# Patient Record
Sex: Male | Born: 1970 | Race: Black or African American | Hispanic: No | Marital: Single | Smoking: Never smoker
Health system: Southern US, Community
[De-identification: ages and names within clinical notes are randomized; demographics above are authoritative.]

## PROBLEM LIST (undated history)

## (undated) DIAGNOSIS — M549 Dorsalgia, unspecified: Secondary | ICD-10-CM

---

## 2002-02-05 ENCOUNTER — Emergency Department (HOSPITAL_COMMUNITY): Admission: EM | Admit: 2002-02-05 | Discharge: 2002-02-05 | Payer: Self-pay | Admitting: Emergency Medicine

## 2002-12-24 ENCOUNTER — Encounter: Payer: Self-pay | Admitting: *Deleted

## 2002-12-24 ENCOUNTER — Emergency Department (HOSPITAL_COMMUNITY): Admission: EM | Admit: 2002-12-24 | Discharge: 2002-12-24 | Payer: Self-pay | Admitting: *Deleted

## 2003-10-14 ENCOUNTER — Emergency Department (HOSPITAL_COMMUNITY): Admission: EM | Admit: 2003-10-14 | Discharge: 2003-10-14 | Payer: Self-pay | Admitting: Emergency Medicine

## 2004-03-09 ENCOUNTER — Emergency Department (HOSPITAL_COMMUNITY): Admission: EM | Admit: 2004-03-09 | Discharge: 2004-03-09 | Payer: Self-pay | Admitting: Emergency Medicine

## 2005-07-15 ENCOUNTER — Emergency Department (HOSPITAL_COMMUNITY): Admission: EM | Admit: 2005-07-15 | Discharge: 2005-07-15 | Payer: Self-pay | Admitting: Emergency Medicine

## 2005-09-15 ENCOUNTER — Emergency Department (HOSPITAL_COMMUNITY): Admission: EM | Admit: 2005-09-15 | Discharge: 2005-09-15 | Payer: Self-pay | Admitting: Emergency Medicine

## 2006-01-17 ENCOUNTER — Emergency Department (HOSPITAL_COMMUNITY): Admission: EM | Admit: 2006-01-17 | Discharge: 2006-01-17 | Payer: Self-pay | Admitting: Emergency Medicine

## 2006-01-23 ENCOUNTER — Emergency Department (HOSPITAL_COMMUNITY): Admission: EM | Admit: 2006-01-23 | Discharge: 2006-01-23 | Payer: Self-pay | Admitting: Emergency Medicine

## 2006-08-10 ENCOUNTER — Emergency Department (HOSPITAL_COMMUNITY): Admission: EM | Admit: 2006-08-10 | Discharge: 2006-08-10 | Payer: Self-pay | Admitting: Emergency Medicine

## 2007-04-29 ENCOUNTER — Emergency Department (HOSPITAL_COMMUNITY): Admission: EM | Admit: 2007-04-29 | Discharge: 2007-04-29 | Payer: Self-pay | Admitting: Emergency Medicine

## 2009-01-07 ENCOUNTER — Emergency Department (HOSPITAL_COMMUNITY): Admission: EM | Admit: 2009-01-07 | Discharge: 2009-01-07 | Payer: Self-pay | Admitting: Emergency Medicine

## 2010-04-19 ENCOUNTER — Emergency Department (HOSPITAL_COMMUNITY): Admission: EM | Admit: 2010-04-19 | Discharge: 2010-04-19 | Payer: Self-pay | Admitting: Emergency Medicine

## 2010-07-10 ENCOUNTER — Emergency Department (HOSPITAL_COMMUNITY): Admission: EM | Admit: 2010-07-10 | Discharge: 2010-07-10 | Payer: Self-pay | Admitting: Emergency Medicine

## 2012-12-20 ENCOUNTER — Emergency Department (HOSPITAL_COMMUNITY)
Admission: EM | Admit: 2012-12-20 | Discharge: 2012-12-20 | Disposition: A | Discharge status: Home / Self Care | Payer: Self-pay | Attending: Emergency Medicine | Admitting: Emergency Medicine

## 2012-12-20 ENCOUNTER — Emergency Department (HOSPITAL_COMMUNITY): Payer: Self-pay

## 2012-12-20 ENCOUNTER — Encounter (HOSPITAL_COMMUNITY): Payer: Self-pay | Admitting: Emergency Medicine

## 2012-12-20 ENCOUNTER — Other Ambulatory Visit: Payer: Self-pay

## 2012-12-20 DIAGNOSIS — Z8739 Personal history of other diseases of the musculoskeletal system and connective tissue: Secondary | ICD-10-CM | POA: Insufficient documentation

## 2012-12-20 DIAGNOSIS — R52 Pain, unspecified: Secondary | ICD-10-CM | POA: Insufficient documentation

## 2012-12-20 DIAGNOSIS — J029 Acute pharyngitis, unspecified: Secondary | ICD-10-CM | POA: Insufficient documentation

## 2012-12-20 DIAGNOSIS — R0789 Other chest pain: Secondary | ICD-10-CM | POA: Insufficient documentation

## 2012-12-20 DIAGNOSIS — B349 Viral infection, unspecified: Secondary | ICD-10-CM

## 2012-12-20 DIAGNOSIS — R079 Chest pain, unspecified: Secondary | ICD-10-CM

## 2012-12-20 DIAGNOSIS — R509 Fever, unspecified: Secondary | ICD-10-CM | POA: Insufficient documentation

## 2012-12-20 DIAGNOSIS — R05 Cough: Secondary | ICD-10-CM | POA: Insufficient documentation

## 2012-12-20 DIAGNOSIS — R110 Nausea: Secondary | ICD-10-CM | POA: Insufficient documentation

## 2012-12-20 DIAGNOSIS — R51 Headache: Secondary | ICD-10-CM | POA: Insufficient documentation

## 2012-12-20 DIAGNOSIS — B9789 Other viral agents as the cause of diseases classified elsewhere: Secondary | ICD-10-CM | POA: Insufficient documentation

## 2012-12-20 DIAGNOSIS — R059 Cough, unspecified: Secondary | ICD-10-CM | POA: Insufficient documentation

## 2012-12-20 HISTORY — DX: Dorsalgia, unspecified: M54.9

## 2012-12-20 MED ORDER — HYDROCODONE-IBUPROFEN 7.5-200 MG PO TABS: 1.0000 | ORAL_TABLET | Freq: Four times a day (QID) | ORAL | Status: DC | PRN

## 2012-12-20 MED ORDER — HYDROCODONE-ACETAMINOPHEN 5-325 MG PO TABS
1.0000 | ORAL_TABLET | Freq: Once | ORAL | Status: AC
  Administered 2012-12-20: 1 via ORAL
  Filled 2012-12-20: qty 1

## 2012-12-20 MED ORDER — IBUPROFEN 800 MG PO TABS
800.0000 mg | ORAL_TABLET | Freq: Once | ORAL | Status: AC
  Administered 2012-12-20: 800 mg via ORAL
  Filled 2012-12-20: qty 1

## 2012-12-20 MED ORDER — DEXTROMETHORPHAN HBR 15 MG/5ML PO SYRP: 10.0000 mL | ORAL_SOLUTION | Freq: Four times a day (QID) | ORAL | Status: DC | PRN

## 2012-12-20 NOTE — ED Provider Notes (Signed)
History     CSN: [id number]  Arrival date & time 12/20/12  8295   First MD Initiated Contact with Patient 12/20/12 [phone]      Chief Complaint  Patient presents with  . Generalized Body Aches  . Chest Pain    (Consider location/radiation/quality/duration/timing/severity/associated sxs/prior treatment) HPI Trevor Hicks is a 41 y.o. male who presents to the Emergency Department complaining of flu like symptoms since Sunday including headache, sore throat, dry cough, mild nausea, body aches. He has been using Dayquil and Nyquil with some relief. Cough has persisted and he has chest discomfort with the cough.   Past Medical History  Diagnosis Date  . Back pain     History reviewed. No pertinent past surgical history.  History reviewed. No pertinent family history.  History  Substance Use Topics  . Smoking status: Never Smoker   . Smokeless tobacco: Not on file  . Alcohol Use: Yes     Comment: one beer per day      Review of Systems  Constitutional: Positive for fever and chills.       10  Systems reviewed and are negative for acute change except as noted in the HPI.  HENT: Positive for sore throat. Negative for congestion.   Eyes: Negative for discharge and redness.  Respiratory: Positive for cough. Negative for shortness of breath.        Chest discomfort with cough  Cardiovascular: Negative for chest pain.  Gastrointestinal: Negative for vomiting and abdominal pain.  Musculoskeletal: Negative for back pain.  Skin: Negative for rash.  Neurological: Positive for headaches. Negative for syncope and numbness.  Psychiatric/Behavioral:       No behavior change.    Allergies  Review of patient's allergies indicates no known allergies.  Home Medications  No current outpatient prescriptions on file.  BP 118/69  Pulse 66  Temp 98.2 F (36.8 C) (Oral)  Resp 16  Ht 6' (1.829 m)  Wt 190 lb (86.183 kg)  BMI 25.77 kg/m2  SpO2 100%  Physical Exam  Nursing note and  vitals reviewed. Constitutional: He appears well-developed and well-nourished.       Awake, alert, nontoxic appearance.  HENT:  Head: Atraumatic.  Right Ear: External ear normal.  Left Ear: External ear normal.  Nose: Nose normal.  Mouth/Throat: Oropharynx is clear and moist.  Eyes: Right eye exhibits no discharge. Left eye exhibits no discharge.  Neck: Neck supple.  Cardiovascular: Normal heart sounds.   Pulmonary/Chest: Effort normal and breath sounds normal. He has no wheezes. He has no rales. He exhibits no tenderness.  Abdominal: Soft. There is no tenderness. There is no rebound.  Musculoskeletal: He exhibits no tenderness.       Baseline ROM, no obvious new focal weakness.  Neurological:       Mental status and motor strength appears baseline for patient and situation.  Skin: No rash noted.  Psychiatric: He has a normal mood and affect.    ED Course  Procedures (including critical care time)   Date: 12/20/2012   0555  Rate: 64  Rhythm: normal sinus rhythm  QRS Axis: normal  Intervals: normal  ST/T Wave abnormalities: normal  Conduction Disutrbances: none  Narrative Interpretation: unremarkable   Dg Chest 2 View  12/20/2012  *RADIOLOGY REPORT*  Clinical Data: Chest pain  CHEST - 2 VIEW  Comparison: 08/10/2006  Findings: Lungs clear.  Heart size and pulmonary vascularity normal.  No effusion.  Visualized bones unremarkable.  IMPRESSION: No acute disease  Original Report Authenticated By: D. Andria Rhein, MD      MDM  Patient with flu like symptoms since Sunday and persistent cough and chest discomfort. Chest xray without acute findings. EKG normal. Given ibuprofen and analgesic with relief. Reviewed results with patient. Pt stable in ED with no significant deterioration in condition.The patient appears reasonably screened and/or stabilized for discharge and I doubt any other medical condition or other [facility] requiring further screening, evaluation, or treatment in the ED at  this time prior to discharge.  MDM Reviewed: nursing note and vitals Interpretation: x-ray and ECG           Nicoletta Dress. Colon Branch, MD 12/20/12 [phone]

## 2012-12-20 NOTE — ED Notes (Signed)
Patient complaining of generalized body aches and chest pain that started 3 days ago. Reports cold symptoms starting prior to body aches.

## 2013-02-20 ENCOUNTER — Ambulatory Visit (HOSPITAL_COMMUNITY)
Admission: RE | Admit: 2013-02-20 | Discharge: 2013-02-20 | Disposition: A | Discharge status: Home / Self Care | Payer: Disability Insurance | Source: Ambulatory Visit | Attending: Pediatrics | Admitting: Pediatrics

## 2013-02-20 ENCOUNTER — Other Ambulatory Visit (HOSPITAL_COMMUNITY): Payer: Self-pay | Admitting: *Deleted

## 2013-02-20 DIAGNOSIS — M545 Low back pain, unspecified: Secondary | ICD-10-CM | POA: Insufficient documentation

## 2013-02-20 DIAGNOSIS — M549 Dorsalgia, unspecified: Secondary | ICD-10-CM

## 2013-02-20 DIAGNOSIS — M79609 Pain in unspecified limb: Secondary | ICD-10-CM | POA: Insufficient documentation

## 2013-02-20 DIAGNOSIS — R413 Other amnesia: Secondary | ICD-10-CM

## 2014-02-23 IMAGING — CR DG CHEST 2V
2 series · 2 of 2 positions shown · non-contrast
Comparison: 08/10/2006

CLINICAL DATA: Chest pain

CHEST - 2 VIEW

[view not recorded (1 of 2)]
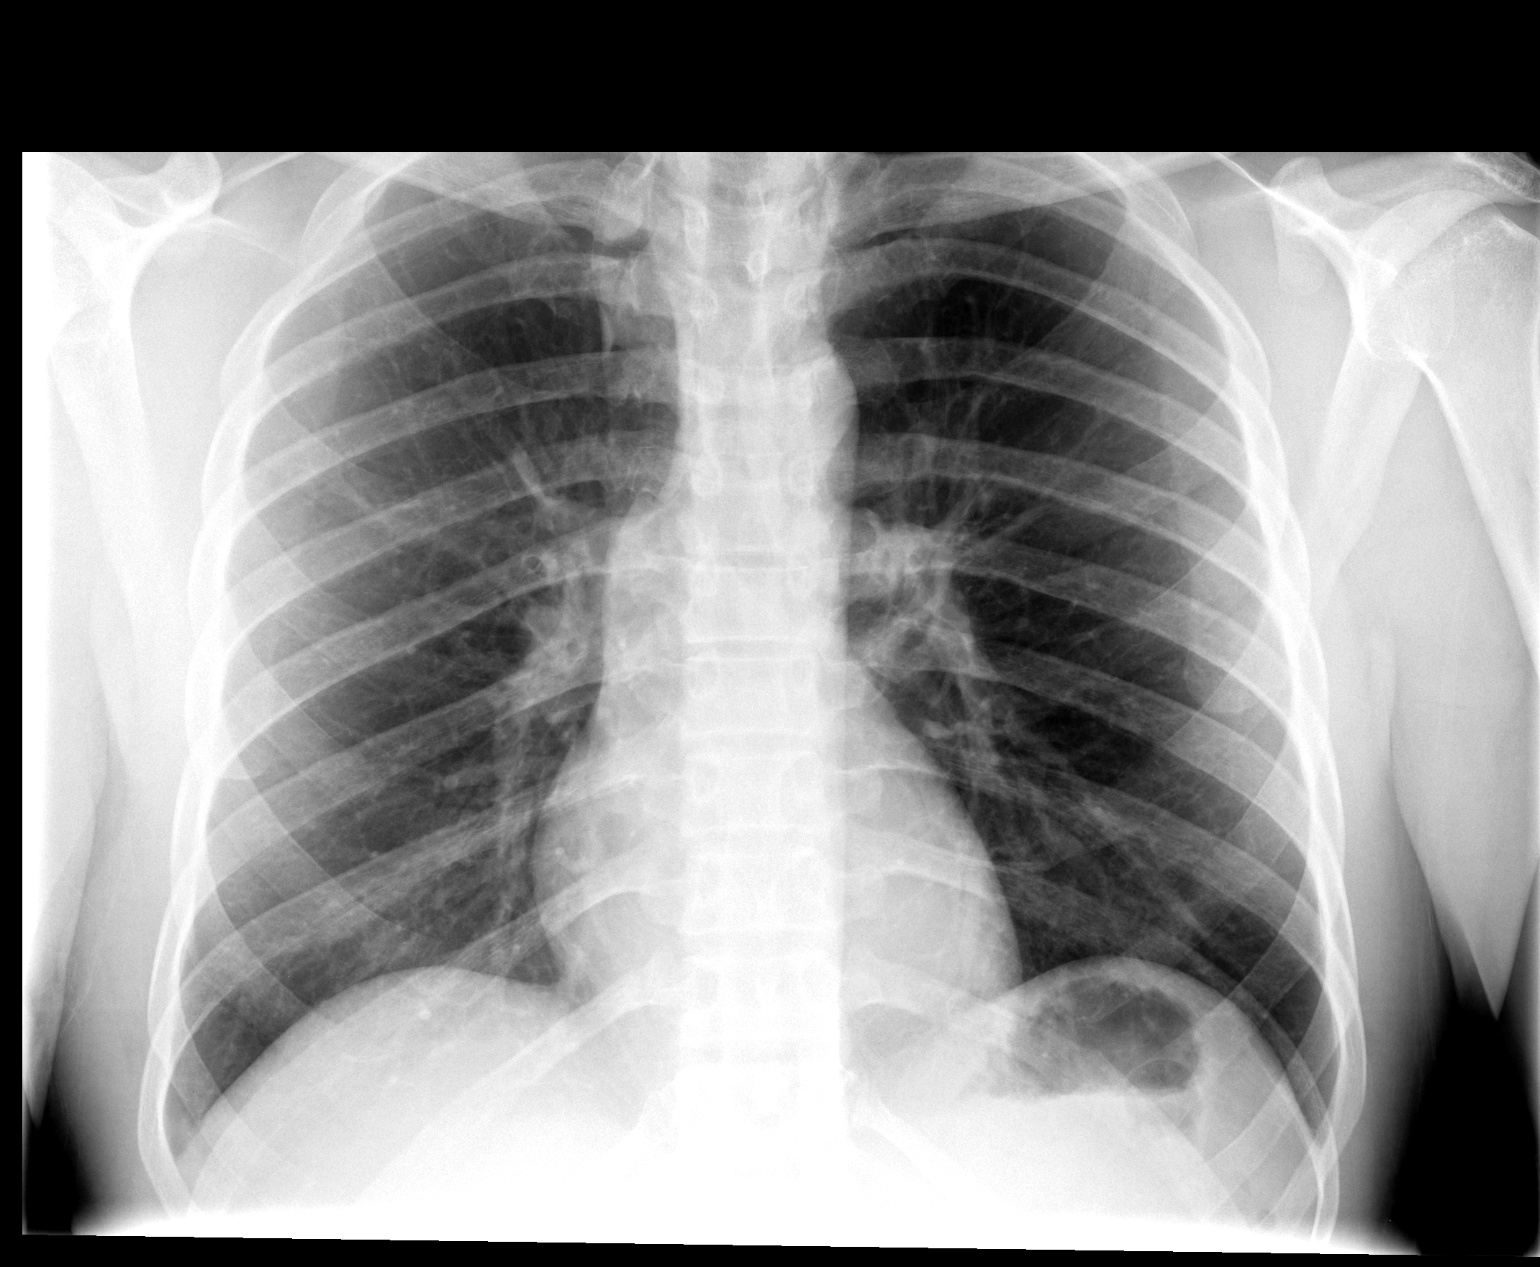

[view not recorded (2 of 2)]
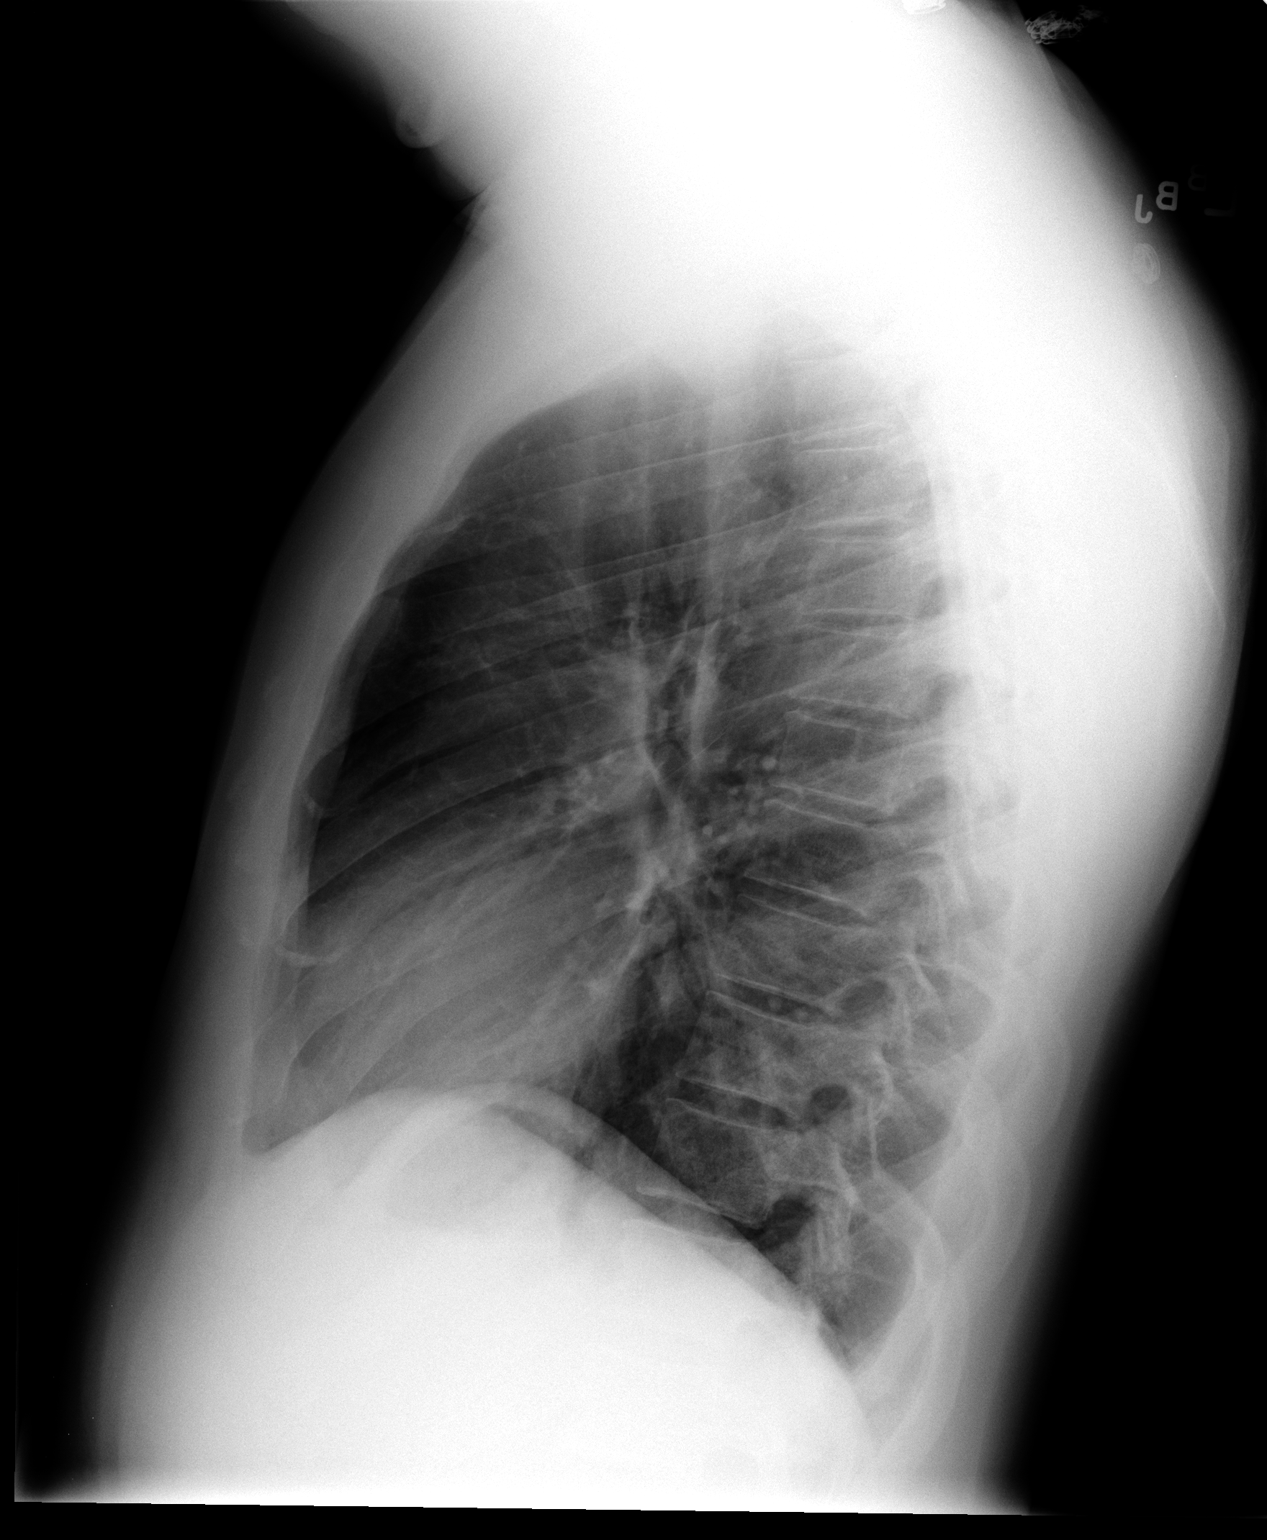

[2 of 2 positions shown; findings below may reference images not displayed]

FINDINGS: Lungs clear.  Heart size and pulmonary vascularity
normal.  No effusion.  Visualized bones unremarkable.
IMPRESSION: No acute disease

## 2014-04-23 ENCOUNTER — Emergency Department (HOSPITAL_COMMUNITY): Payer: 59

## 2014-04-23 ENCOUNTER — Encounter (HOSPITAL_COMMUNITY): Payer: Self-pay | Admitting: Emergency Medicine

## 2014-04-23 ENCOUNTER — Emergency Department (HOSPITAL_COMMUNITY)
Admission: EM | Admit: 2014-04-23 | Discharge: 2014-04-23 | Disposition: A | Discharge status: Home / Self Care | Payer: 59 | Attending: Emergency Medicine | Admitting: Emergency Medicine

## 2014-04-23 DIAGNOSIS — R296 Repeated falls: Secondary | ICD-10-CM | POA: Insufficient documentation

## 2014-04-23 DIAGNOSIS — S93409A Sprain of unspecified ligament of unspecified ankle, initial encounter: Secondary | ICD-10-CM | POA: Insufficient documentation

## 2014-04-23 DIAGNOSIS — Y939 Activity, unspecified: Secondary | ICD-10-CM | POA: Insufficient documentation

## 2014-04-23 DIAGNOSIS — Y929 Unspecified place or not applicable: Secondary | ICD-10-CM | POA: Insufficient documentation

## 2014-04-23 MED ORDER — NAPROXEN 500 MG PO TABS
500.0000 mg | ORAL_TABLET | Freq: Two times a day (BID) | ORAL | Status: DC
Start: 2014-04-23 — End: 2015-10-05

## 2014-04-23 NOTE — ED Notes (Signed)
Pt. Reports falling last wed. Pt. Reports right ankle pain. Pt. Ambulated to room, did not want wheelchair.

## 2014-04-23 NOTE — ED Notes (Signed)
Pt d/c to home with NAD. 

## 2014-04-23 NOTE — ED Provider Notes (Signed)
CSN: [id number]     Arrival date & time 04/23/14  [id number] History  This chart was scribed for Trevor LennertJoseph L Owin Vignola, MD by Leone PayorSonum Patel, ED Scribe. This patient was seen in room APA03/APA03 and the patient's care was started 7:12 AM.    Chief Complaint  Patient presents with  . Ankle Pain      Patient is a 43 y.o. male presenting with ankle pain. The history is provided by the patient. No language interpreter was used.  Ankle Pain Location:  Ankle Time since incident:  8 days Injury: yes   Mechanism of injury: fall   Ankle location:  R ankle Pain details:    Radiates to:  Does not radiate   Severity:  Mild   Timing:  Constant   Progression:  Unchanged Chronicity:  New Dislocation: no   Foreign body present:  No foreign bodies Worsened by:  Activity Ineffective treatments:  NSAIDs, acetaminophen and rest Associated symptoms: no back pain and no fatigue     HPI Comments: Trevor Hicks is a 43 y.o. male who presents to the Emergency Department complaining of constant, unchanged right ankle pain that began after a fall that occurred 8 days ago. He states this pain is worse with movement and walking. Patient has taken OTC pain medication without relief. He denies numbness or weakness.    Past Medical History  Diagnosis Date  . Back pain    History reviewed. No pertinent past surgical history. No family history on file. History  Substance Use Topics  . Smoking status: Never Smoker   . Smokeless tobacco: Not on file  . Alcohol Use: Yes     Comment: one beer per day    Review of Systems  Constitutional: Negative for appetite change and fatigue.  HENT: Negative for congestion, ear discharge and sinus pressure.   Eyes: Negative for discharge.  Respiratory: Negative for cough.   Cardiovascular: Negative for chest pain.  Gastrointestinal: Negative for abdominal pain and diarrhea.  Genitourinary: Negative for frequency and hematuria.  Musculoskeletal: Positive for arthralgias (right  ankle). Negative for back pain.  Skin: Negative for rash.  Neurological: Negative for seizures, weakness, numbness and headaches.  Psychiatric/Behavioral: Negative for hallucinations.      Allergies  Review of patient's allergies indicates no known allergies.  Home Medications   Prior to Admission medications   Medication Sig Start Date End Date Taking? Authorizing Provider  dextromethorphan 15 MG/5ML syrup Take 10 mLs (30 mg total) by mouth 4 (four) times daily as needed for cough. 12/20/12   Annamarie Dawleyerry S Strand, MD  HYDROcodone-ibuprofen (VICOPROFEN) 7.5-200 MG per tablet Take 1 tablet by mouth every 6 (six) hours as needed for pain. 12/20/12   Annamarie Dawleyerry S Strand, MD   BP 133/81  Pulse 58  Temp(Src) 98 F (36.7 C) (Oral)  Resp 16  Ht 6\' 1"  (1.854 m)  Wt 195 lb (88.451 kg)  BMI 25.73 kg/m2  SpO2 100% Physical Exam  Nursing note and vitals reviewed. Constitutional: He is oriented to person, place, and time. He appears well-developed.  HENT:  Head: Normocephalic.  Eyes: Conjunctivae are normal.  Neck: No tracheal deviation present.  Cardiovascular:  No murmur heard. Musculoskeletal: Normal range of motion. He exhibits edema and tenderness.  RLE: Mild swelling to lateral right ankle. Moderate tenderness.   Neurological: He is oriented to person, place, and time.  Skin: Skin is warm.  Psychiatric: He has a normal mood and affect.    ED Course  Procedures (including  critical care time)  DIAGNOSTIC STUDIES: Oxygen Saturation is 100% on RA, normal by my interpretation.    COORDINATION OF CARE: 7:15 AM Will XRAY right ankle. Discussed treatment plan with pt at bedside and pt agreed to plan.   Labs Review Labs Reviewed - No data to display  Imaging Review No results found.   EKG Interpretation None      MDM   Final diagnoses:  None    The chart was scribed for me under my direct supervision.  I personally performed the history, physical, and medical decision making  and all procedures in the evaluation of this patient.Trevor Hicks.   Ephrata Verville L Jaelene Garciagarcia, MD 04/23/14 [phone]

## 2014-12-21 ENCOUNTER — Emergency Department (HOSPITAL_COMMUNITY)
Admission: EM | Admit: 2014-12-21 | Discharge: 2014-12-21 | Disposition: A | Discharge status: Home / Self Care | Payer: Disability Insurance | Attending: Emergency Medicine | Admitting: Emergency Medicine

## 2014-12-21 ENCOUNTER — Encounter (HOSPITAL_COMMUNITY): Payer: Self-pay | Admitting: Emergency Medicine

## 2014-12-21 DIAGNOSIS — M5441 Lumbago with sciatica, right side: Secondary | ICD-10-CM

## 2014-12-21 DIAGNOSIS — Z79899 Other long term (current) drug therapy: Secondary | ICD-10-CM | POA: Insufficient documentation

## 2014-12-21 DIAGNOSIS — Z791 Long term (current) use of non-steroidal anti-inflammatories (NSAID): Secondary | ICD-10-CM | POA: Insufficient documentation

## 2014-12-21 MED ORDER — METHOCARBAMOL 500 MG PO TABS: 500.0000 mg | ORAL_TABLET | Freq: Two times a day (BID) | ORAL | Status: DC

## 2014-12-21 MED ORDER — HYDROCODONE-ACETAMINOPHEN 5-325 MG PO TABS: 1.0000 | ORAL_TABLET | Freq: Four times a day (QID) | ORAL | Status: DC | PRN

## 2014-12-21 MED ORDER — IBUPROFEN 800 MG PO TABS: 800.0000 mg | ORAL_TABLET | Freq: Three times a day (TID) | ORAL | Status: DC

## 2014-12-21 MED ORDER — IBUPROFEN 800 MG PO TABS
800.0000 mg | ORAL_TABLET | Freq: Once | ORAL | Status: AC
  Administered 2014-12-21: 800 mg via ORAL
  Filled 2014-12-21: qty 1

## 2014-12-21 MED ORDER — METHOCARBAMOL 500 MG PO TABS
500.0000 mg | ORAL_TABLET | Freq: Once | ORAL | Status: AC
  Administered 2014-12-21: 500 mg via ORAL
  Filled 2014-12-21: qty 1

## 2014-12-21 NOTE — ED Notes (Signed)
Pt reports lower back pain since moving a cough this am. Pt reports pain radiating to RLE. Pt reports pain is worse with sitting. Pt ambulates with steady gait. nad noted.

## 2014-12-21 NOTE — Discharge Instructions (Signed)
1. Medications: robaxin, ibuprofen, vicodin, usual home medications °2. Treatment: rest, drink plenty of fluids, gentle stretching as discussed, alternate ice and heat °3. Follow Up: Please followup with your primary doctor in 3 days for discussion of your diagnoses and further evaluation after today's visit; if you do not have a primary care doctor use the resource guide provided to find one;  Return to the ER for worsening back pain, difficulty walking, loss of bowel or bladder control or other concerning symptoms ° ° ° ° °Back Exercises °Back exercises help treat and prevent back injuries. The goal of back exercises is to increase the strength of your abdominal and back muscles and the flexibility of your back. These exercises should be started when you no longer have back pain. Back exercises include: °· Pelvic Tilt. Lie on your back with your knees bent. Tilt your pelvis until the lower part of your back is against the floor. Hold this position 5 to 10 sec and repeat 5 to 10 times. °· Knee to Chest. Pull first 1 knee up against your chest and hold for 20 to 30 seconds, repeat this with the other knee, and then both knees. This may be done with the other leg straight or bent, whichever feels better. °· Sit-Ups or Curl-Ups. Bend your knees 90 degrees. Start with tilting your pelvis, and do a partial, slow sit-up, lifting your trunk only 30 to 45 degrees off the floor. Take at least 2 to 3 seconds for each sit-up. Do not do sit-ups with your knees out straight. If partial sit-ups are difficult, simply do the above but with only tightening your abdominal muscles and holding it as directed. °· Hip-Lift. Lie on your back with your knees flexed 90 degrees. Push down with your feet and shoulders as you raise your hips a couple inches off the floor; hold for 10 seconds, repeat 5 to 10 times. °· Back arches. Lie on your stomach, propping yourself up on bent elbows. Slowly press on your hands, causing an arch in your low  back. Repeat 3 to 5 times. Any initial stiffness and discomfort should lessen with repetition over time. °· Shoulder-Lifts. Lie face down with arms beside your body. Keep hips and torso pressed to floor as you slowly lift your head and shoulders off the floor. °Do not overdo your exercises, especially in the beginning. Exercises may cause you some mild back discomfort which lasts for a few minutes; however, if the pain is more severe, or lasts for more than 15 minutes, do not continue exercises until you see your caregiver. Improvement with exercise therapy for back problems is slow.  °See your caregivers for assistance with developing a proper back exercise program. °Document Released: 01/18/2005 Document Revised: 03/04/2012 Document Reviewed: 10/12/2011 °ExitCare® Patient Information ©2015 [employer]. This information is not intended to replace advice given to you by your health care provider. Make sure you discuss any questions you have with your health care provider. ° ° ° °Emergency Department Resource Guide °1) Find a Doctor and Pay Out of Pocket °Although you won't have to find out who is covered by your insurance plan, it is a good idea to ask around and get recommendations. You will then need to call the office and see if the doctor you have chosen will accept you as a new patient and what types of options they offer for patients who are self-pay. Some doctors offer discounts or will set up payment plans for their patients who do not   have insurance, but you will need to ask so you aren't surprised when you get to your appointment. ° °2) Contact Your Local Health Department °Not all health departments have doctors that can see patients for sick visits, but many do, so it is worth a call to see if yours does. If you don't know where your local health department is, you can check in your phone book. The CDC also has a tool to help you locate your state's health department, and many state websites also have  listings of all of their local health departments. ° °3) Find a Walk-in Clinic °If your illness is not likely to be very severe or complicated, you may want to try a walk in clinic. These are popping up all over the country in pharmacies, drugstores, and shopping centers. They're usually staffed by nurse practitioners or physician assistants that have been trained to treat common illnesses and complaints. They're usually fairly quick and inexpensive. However, if you have serious medical issues or chronic medical problems, these are probably not your best option. ° °No Primary Care Doctor: °- Call Health Connect at  [phone] - they can help you locate a primary care doctor that  accepts your insurance, provides certain services, etc. °- Physician Referral Service- 1-800-533-3463 ° °Chronic Pain Problems: °Organization         Address  Phone   Notes  °[facility]  (336) 297-2271 Patients need to be referred by their primary care doctor.  ° °Medication Assistance: °Organization         Address  Phone   Notes  °[place] [facility] [address] °[place], [place] [address] (336) 641-8030 --Must be a resident of [place] °-- Must have NO insurance coverage whatsoever (no Medicaid/ Medicare, etc.) °-- The pt. MUST have a primary care doctor that directs their care regularly and follows them in the community °  °[facility]  (866) 331-1348   °[employer]  (888) 892-1162   ° °Agencies that provide inexpensive medical care: °Organization         Address  Phone   Notes  °[facility]  (336) 832-8035   °[facility]    (336) 832-7272   °[facility] [address] °[place], [place] [address] (336) 832-4777   °[facility] [address], °[place] (336) 271-4999   °[facility]    (336) 373-0678   °[facility]    (336) 272-1050   °[facility] ° [address], [place] Phone:  (336) 832-4444, Fax:  (336) 832-4440 Hours of Operation:  9 am - 6 pm, M-F.  Also accepts Medicaid/Medicare and self-pay.  °[facility] ° [address], [place] Phone: (336) 832-3150, Fax: (336) 832-3151. Hours of Operation:  8:30 am - 5:30 pm, M-F.  Also accepts Medicaid and self-pay.  °[facility] [place] [address], [place] Phone: (336) 878-6027   °[facility] [address], [place], [place] (336)723-1848, Ext. 123 Mondays & Thursdays: 7-9 AM.  First 15 patients are seen on a first come, first serve basis. °  ° °Medicaid-accepting [place] Providers: ° °Organization         Address  Phone   Notes  °[facility] [address], [place] (336) 641-2100 Also accepts self-pay patients.  °[facility] [address], [place] ° (336) 856-9996   °[facility] [address], [place] (336) 288-8857   °[facility] [address] [place] [address], [place] (336) 299-7000   °Veita Bland [address], [place]  ° (336) 373-1557 Only accepts [place] Access Medicaid patients after they have their name applied to their card.  ° °Self-Pay (no insurance) in [place]: ° °Organization         Address  Phone   Notes  °Sickle Cell Patients, [facility] [address], [place] (336) 832-1970   °[facility] [address], [place] (336) 832-4400   ° [facility] ° [address], [place] (336) 992-4800   °[facility]/Dr. Osei-Bonsu ° [address] [place] [address], [place] or [address], [place] (336) [phone] Phone number for both [place] and [place] locations is the same.  °[facility] [address], [place] (336) 299-0000   °[facility] [address] [place] [address],  [place] or [address] (336) 852-7530 °(336) 878-2260   °[facility] [address], [place] (336) 350-1642, phone; (336) 294-5005, fax Sees patients 1st and 3rd Saturday of every month.  Must not qualify for public or private insurance (i.e. Medicaid, Medicare, [place] Health Choice, Veterans' Benefits) • Household income should be no more than 200% of the poverty level •The clinic cannot treat you if you are pregnant or think you are pregnant • Sexually transmitted diseases are not treated at the clinic.  ° ° °Dental Care: °Organization         Address  Phone  Notes  °[place] [facility] [address],  (336) 641-6152 Accepts children up to age 21 who are enrolled in Medicaid or [place] Health Choice; pregnant women with a Medicaid card; and children who have applied for Medicaid or [place] Health Choice, but were declined, whose parents can pay a reduced fee at time of service.  °[facility] [place]  [address], [place] (336) 641-7733 Accepts children up to age 21 who are enrolled in Medicaid or [place] Health Choice; pregnant women with a Medicaid card; and children who have applied for Medicaid or  Health Choice, but were declined, whose parents can pay a reduced fee at time of service.  °[facility] ° [address],  (336) 641-4533 Patients are seen by appointment only. Walk-ins are not accepted. [facility] will see patients 18 years of age and older. °Monday - Tuesday (8am-5pm) °Most Wednesdays (8:30-5pm) °$30 per visit, cash only  °[facility] ° [address], [place] (336) [phone] Patients are seen by appointment only. Walk-ins are not accepted. [facility] will see patients 18 years of age and older. °One Wednesday Evening (Monthly: Volunteer Based).  $30 per visit, cash only  °[facility]  (919) 537-3737 for adults; Children under age 4, call Graduate Pediatric Dentistry at ([phone]) [phone]. Children aged 4-14, please call ([phone]) [phone] to request a pediatric application. ° Dental services are provided in all areas of dental care including fillings, crowns and bridges, complete and partial dentures, implants, gum treatment, root canals, and extractions. Preventive care is also provided. Treatment is provided to both adults and children. °Patients are selected   via a lottery and there is often a waiting list.   [facility] [address], [place]  [phone] [email or web address]   [facility] [address] [place], [place] (706) [phone], Ext. 123 Second and Fourth Thursday of each month, opens at 6:30 AM; Clinic ends at 9 AM.  Patients are seen on a first-come first-served basis, and a limited number are seen during each clinic.   [facility]  [address] [place], [place] [phone]   Eligibility Requirements You must have lived in [place], [place], or [place] counties for at least the last three months.   You cannot be eligible for state or federal sponsored [employer], including [employer], [place], or [employer].   You generally cannot be eligible for healthcare insurance through your employer.    How to apply: Eligibility screenings are held every Tuesday and Wednesday afternoon from 1:00 pm until 4:00 pm. You do not need an appointment for the interview!  [facility] [address], [place], [place]   [place]  [place] Department  [place]  ([phone]) [phone]    Behavioral Health Resources in the Community: Intensive Outpatient Programs Organization         Address  Phone  Notes  [facility] [address] [place], [place] (717) 669-0420     [facility] Outpatient [address] [place], [place]   [facility] [address] [place], [place]   [place] [address],  [place], [place] or 629-047-7133   Substance Abuse Resources Organization         Address  Phone  Notes  [facility]  919-180-0944   [place]  (504)412-7821   [facility]   [place] Pester  608-128-5017   Residential & Outpatient Substance Abuse Program  229-661-3722   Psychological Services Organization         Address  Phone  Notes  [facility] [place]  [place]  339-016-8701   [place] [address], [place] or (640) 116-7793    Mobile Crisis Teams Organization         Address  Phone  Notes  [facility], Mobile Crisis Care Unit  (913)583-5610   [facility]  [address] [place], [place]   [place] [address], [place] [phone]    Self-Help/Support Groups Organization         Address  Phone             Notes  [place]. of [place] - variety of support groups  [place] Call for more information  Narcotics Anonymous (NA), [facility] [address], [employer] [place]  2 meetings at this location   Special educational needs teacher         Address  Phone  Notes  [facility] [place],    [place]  1-(843)591-0072   [facility]  [address] [place] [address], [place], [place]   [place], [place] [phone] Admissions: 8am-3pm M-F  [facility] [place] [address],    [place], [place]   [address], [place], [place] [phone]   °[facility] [address]  °[place],  [place] 336-285-9073   °[facility] Intensive Outpatient [address], [place], [place] 336-852-3033   °[facility] [address]  °[place], [place] 1-877-615-2722 or 336-784-9470   °[facility] [address] [place], [place] 336-227-7417 Accepts Medicaid  °[facility] [address]  ° [place] 1-800-659-3381 Substance Abuse/Addiction Treatment  ° °[place] Behavioral Health Resources °Organization         Address  Phone  Notes  °[facility]  (888) 581-9988   °Julie Brannon, PhD [address] [place], [place]   (336) 349-5553 or (336) 951-0000   °[facility]   [address] °[place], [place] (336) 349-4454   °[facility] [address], [place], [place] (336) 342-8316 Insurance/Medicaid/sponsorship through [facility]  °[facility] [address]                                    [place], [place] (336) 342-8316 Therapy/tele-psych/case  °[facility] [address]  ° [place], [place] (336) 349-2233    °Dr. Arfeen  (336) [phone]   °[facility] [place]  [employer] [place] [facility] 1) [address], [place] °2) [address], [place] °3)  [address], [place] (336) 349-3220 °(336) 342-7768 ° °(336) 342-8140   °[place] Child Abuse Hotline (336) 342-1394 or (336) 342-3537 (After Hours)    ° ° ° °

## 2014-12-21 NOTE — ED Provider Notes (Signed)
CSN: [id number]     Arrival date & time 12/21/14  [id number] History   First MD Initiated Contact with Patient 12/21/14 0901     Chief Complaint  Patient presents with  . Back Pain     (Consider location/radiation/quality/duration/timing/severity/associated sxs/prior Treatment) Patient is a 43 y.o. male presenting with back pain. The history is provided by the patient and medical records. No language interpreter was used.  Back Pain Associated symptoms: no abdominal pain, no chest pain, no dysuria, no fever, no headaches, no numbness and no weakness      Trevor Hicks is a 43 y.o. male  with a hx of back pain presents to the Emergency Department complaining of gradual, persistent, progressively worsening low back pain with associated radiation into the right leg onset this morning after moving a couch. Patient reports history of low back pain however without radiation. He denies falls or known trauma. Patient denies treatments prior to arrival. Nothing makes the symptoms better and sitting and walking, particularly stairs makes his pain worse. Patient denies history of anticoagulant usage, IV drug usage and personal history of cancer. He denies loss of bowel or bladder control, numbness or weakness in the legs, saddle anesthesia.  He also denies fevers, chills, neck pain, upper back pain, syncope, dysuria, hematuria, testicular pain, pain with defecation, penile discharge or penile pain.  Past Medical History  Diagnosis Date  . Back pain    History reviewed. No pertinent past surgical history. History reviewed. No pertinent family history. History  Substance Use Topics  . Smoking status: Never Smoker   . Smokeless tobacco: Not on file  . Alcohol Use: Yes     Comment: one beer per day    Review of Systems  Constitutional: Negative for fever and fatigue.  Respiratory: Negative for chest tightness and shortness of breath.   Cardiovascular: Negative for chest pain.  Gastrointestinal:  Negative for nausea, vomiting, abdominal pain and diarrhea.  Genitourinary: Negative for dysuria, urgency, frequency and hematuria.  Musculoskeletal: Positive for back pain and gait problem (2/2 pain). Negative for joint swelling, neck pain and neck stiffness.  Skin: Negative for rash.  Neurological: Negative for weakness, light-headedness, numbness and headaches.  All other systems reviewed and are negative.     Allergies  Review of patient's allergies indicates no known allergies.  Home Medications   Prior to Admission medications   Medication Sig Start Date End Date Taking? Authorizing Provider  dextromethorphan 15 MG/5ML syrup Take 10 mLs (30 mg total) by mouth 4 (four) times daily as needed for cough. Patient not taking: Reported on 12/21/2014 12/20/12   Annamarie Dawleyerry S Strand, MD  HYDROcodone-acetaminophen (NORCO/VICODIN) 5-325 MG per tablet Take 1-2 tablets by mouth every 6 (six) hours as needed for moderate pain or severe pain. 12/21/14   Kason Benak, PA-C  HYDROcodone-ibuprofen (VICOPROFEN) 7.5-200 MG per tablet Take 1 tablet by mouth every 6 (six) hours as needed for pain. Patient not taking: Reported on 12/21/2014 12/20/12   Annamarie Dawleyerry S Strand, MD  ibuprofen (ADVIL,MOTRIN) 800 MG tablet Take 1 tablet (800 mg total) by mouth 3 (three) times daily. 12/21/14   Vaniya Augspurger, PA-C  methocarbamol (ROBAXIN) 500 MG tablet Take 1 tablet (500 mg total) by mouth 2 (two) times daily. 12/21/14   Park Beck, PA-C  naproxen (NAPROSYN) 500 MG tablet Take 1 tablet (500 mg total) by mouth 2 (two) times daily. Patient not taking: Reported on 12/21/2014 04/23/14   Benny LennertJoseph L Zammit, MD   BP 136/93 mmHg  Pulse 57  Temp(Src) 97.8 F (36.6 C)  Resp 18  Ht 5\' 10"  (1.778 m)  Wt 190 lb (86.183 kg)  BMI 27.26 kg/m2  SpO2 100% Physical Exam  Constitutional: He appears well-developed and well-nourished. No distress.  HENT:  Head: Normocephalic and atraumatic.  Mouth/Throat: Oropharynx  is clear and moist. No oropharyngeal exudate.  Eyes: Conjunctivae are normal.  Neck: Normal range of motion. Neck supple.  Full ROM without pain  Cardiovascular: Normal rate, regular rhythm, normal heart sounds and intact distal pulses.   Pulmonary/Chest: Effort normal and breath sounds normal. No respiratory distress. He has no wheezes.  Abdominal: Soft. He exhibits no distension. There is no tenderness.  Musculoskeletal:  Full range of motion of the T-spine and L-spine No tenderness to palpation of the spinous processes of the T-spine or L-spine Mild tenderness to palpation of the right paraspinous muscles of the L-spine and over the right SI joint Pain with radiation on the right while raising the left leg Reproducible pain with palpation to the right buttock  Lymphadenopathy:    He has no cervical adenopathy.  Neurological: He is alert. He has normal reflexes.  Reflex Scores:      Bicep reflexes are 2+ on the right side and 2+ on the left side.      Brachioradialis reflexes are 2+ on the right side and 2+ on the left side.      Patellar reflexes are 2+ on the right side and 2+ on the left side.      Achilles reflexes are 2+ on the right side and 2+ on the left side. Speech is clear and goal oriented, follows commands Normal 5/5 strength in upper and lower extremities bilaterally including dorsiflexion and plantar flexion, strong and equal grip strength Sensation normal to light and sharp touch Moves extremities without ataxia, coordination intact Antalgic gait Normal balance No Clonus  Skin: Skin is warm and dry. No rash noted. He is not diaphoretic. No erythema.  Psychiatric: He has a normal mood and affect. His behavior is normal.  Nursing note and vitals reviewed.   ED Course  Procedures (including critical care time) Labs Review Labs Reviewed - No data to display  Imaging Review No results found.   EKG Interpretation None      MDM   Final diagnoses:   Right-sided low back pain with right-sided sciatica   Trevor Blossom presents with history and physical consistent with sciatica.  Normal neurological exam, no evidence of urinary incontinence or retention, pain is consistently reproducible. There is no evidence of AAA or concern for dissection at this time.   Patient can walk but states is painful.  No loss of bowel or bladder control.  No concern for cauda equina.  No fever, night sweats, weight loss, h/o cancer, IVDU.  Pain treated here in the department with adequate improvement. RICE protocol and pain medicine indicated and discussed with patient. I have also discussed reasons to return immediately to the ER.  Patient expresses understanding and agrees with plan.  I have personally reviewed patient's vitals, nursing note and any pertinent labs or imaging.  I performed an focused physical exam; undressed when appropriate .    It has been determined that no acute conditions requiring further emergency intervention are present at this time. The patient/guardian have been advised of the diagnosis and plan. I reviewed any labs and imaging including any potential incidental findings. We have discussed signs and symptoms that warrant return to the ED and  they are listed in the discharge instructions.    Vital signs are stable at discharge.   BP 136/93 mmHg  Pulse 57  Temp(Src) 97.8 F (36.6 C)  Resp 18  Ht 5\' 10"  (1.778 m)  Wt 190 lb (86.183 kg)  BMI 27.26 kg/m2  SpO2 100%        Dierdre ForthHannah Tonya Carlile, PA-C 12/21/14 0924  Layla MawKristen N Ward, DO 12/21/14 [id number]

## 2015-07-09 ENCOUNTER — Emergency Department (HOSPITAL_COMMUNITY): Payer: No Typology Code available for payment source

## 2015-07-09 ENCOUNTER — Encounter (HOSPITAL_COMMUNITY): Payer: Self-pay | Admitting: *Deleted

## 2015-07-09 ENCOUNTER — Emergency Department (HOSPITAL_COMMUNITY)
Admission: EM | Admit: 2015-07-09 | Discharge: 2015-07-09 | Disposition: A | Discharge status: Home / Self Care | Payer: No Typology Code available for payment source | Attending: Emergency Medicine | Admitting: Emergency Medicine

## 2015-07-09 DIAGNOSIS — S90561A Insect bite (nonvenomous), right ankle, initial encounter: Secondary | ICD-10-CM | POA: Insufficient documentation

## 2015-07-09 DIAGNOSIS — Y998 Other external cause status: Secondary | ICD-10-CM | POA: Diagnosis not present

## 2015-07-09 DIAGNOSIS — Z791 Long term (current) use of non-steroidal anti-inflammatories (NSAID): Secondary | ICD-10-CM | POA: Insufficient documentation

## 2015-07-09 DIAGNOSIS — Y9389 Activity, other specified: Secondary | ICD-10-CM | POA: Insufficient documentation

## 2015-07-09 DIAGNOSIS — S4991XA Unspecified injury of right shoulder and upper arm, initial encounter: Secondary | ICD-10-CM | POA: Insufficient documentation

## 2015-07-09 DIAGNOSIS — Y92410 Unspecified street and highway as the place of occurrence of the external cause: Secondary | ICD-10-CM | POA: Diagnosis not present

## 2015-07-09 DIAGNOSIS — S39012A Strain of muscle, fascia and tendon of lower back, initial encounter: Secondary | ICD-10-CM | POA: Diagnosis not present

## 2015-07-09 DIAGNOSIS — S3992XA Unspecified injury of lower back, initial encounter: Secondary | ICD-10-CM | POA: Diagnosis present

## 2015-07-09 MED ORDER — METHOCARBAMOL 500 MG PO TABS: 500.0000 mg | ORAL_TABLET | Freq: Three times a day (TID) | ORAL | Status: DC | PRN

## 2015-07-09 MED ORDER — IBUPROFEN 600 MG PO TABS: 600.0000 mg | ORAL_TABLET | Freq: Three times a day (TID) | ORAL | Status: DC | PRN
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
## 2015-07-09 NOTE — Discharge Instructions (Signed)
Lumbosacral Strain °Lumbosacral strain is a strain of any of the parts that make up your lumbosacral vertebrae. Your lumbosacral vertebrae are the bones that make up the lower third of your backbone. Your lumbosacral vertebrae are held together by muscles and tough, fibrous tissue (ligaments).  °CAUSES  °A sudden blow to your back can cause lumbosacral strain. Also, anything that causes an excessive stretch of the muscles in the low back can cause this strain. This is typically seen when people exert themselves strenuously, fall, lift heavy objects, bend, or crouch repeatedly. °RISK FACTORS °· Physically demanding work. °· Participation in pushing or pulling sports or sports that require a sudden twist of the back (tennis, golf, baseball). °· Weight lifting. °· Excessive lower back curvature. °· Forward-tilted pelvis. °· Weak back or abdominal muscles or both. °· Tight hamstrings. °SIGNS AND SYMPTOMS  °Lumbosacral strain may cause pain in the area of your injury or pain that moves (radiates) down your leg.  °DIAGNOSIS °Your health care provider can often diagnose lumbosacral strain through a physical exam. In some cases, you may need tests such as X-ray exams.  °TREATMENT  °Treatment for your lower back injury depends on many factors that your clinician will have to evaluate. However, most treatment will include the use of anti-inflammatory medicines. °HOME CARE INSTRUCTIONS  °· Avoid hard physical activities (tennis, racquetball, waterskiing) if you are not in proper physical condition for it. This may aggravate or create problems. °· If you have a back problem, avoid sports requiring sudden body movements. Swimming and walking are generally safer activities. °· Maintain good posture. °· Maintain a healthy weight. °· For acute conditions, you may put ice on the injured area. °¨ Put ice in a plastic bag. °¨ Place a towel between your skin and the bag. °¨ Leave the ice on for 20 minutes, 2-3 times a day. °· When the  low back starts healing, stretching and strengthening exercises may be recommended. °SEEK MEDICAL CARE IF: °· Your back pain is getting worse. °· You experience severe back pain not relieved with medicines. °SEEK IMMEDIATE MEDICAL CARE IF:  °· You have numbness, tingling, weakness, or problems with the use of your arms or legs. °· There is a change in bowel or bladder control. °· You have increasing pain in any area of the body, including your belly (abdomen). °· You notice shortness of breath, dizziness, or feel faint. °· You feel sick to your stomach (nauseous), are throwing up (vomiting), or become sweaty. °· You notice discoloration of your toes or legs, or your feet get very cold. °MAKE SURE YOU:  °· Understand these instructions. °· Will watch your condition. °· Will get help right away if you are not doing well or get worse. °Document Released: 09/20/2005 Document Revised: 12/16/2013 Document Reviewed: 07/30/2013 °[employer]® Patient Information ©2015 [employer]. This information is not intended to replace advice given to you by your health care provider. Make sure you discuss any questions you have with your health care provider.\ °\ °\ °Motor Vehicle Collision °It is common to have multiple bruises and sore muscles after a motor vehicle collision (MVC). These tend to feel worse for the first 24 hours. You may have the most stiffness and soreness over the first several hours. You may also feel worse when you wake up the first morning after your collision. After this point, you will usually begin to improve with each day. The speed of improvement often depends on the severity of the collision, the number of   injuries, and the location and nature of these injuries. °HOME CARE INSTRUCTIONS °· Put ice on the injured area. °¨ Put ice in a plastic bag. °¨ Place a towel between your skin and the bag. °¨ Leave the ice on for 15-20 minutes, 3-4 times a day, or as directed by your health care provider. °· Drink  enough fluids to keep your urine clear or pale yellow. Do not drink alcohol. °· Take a warm shower or bath once or twice a day. This will increase blood flow to sore muscles. °· You may return to activities as directed by your caregiver. Be careful when lifting, as this may aggravate neck or back pain. °· Only take over-the-counter or prescription medicines for pain, discomfort, or fever as directed by your caregiver. Do not use aspirin. This may increase bruising and bleeding. °SEEK IMMEDIATE MEDICAL CARE IF: °· You have numbness, tingling, or weakness in the arms or legs. °· You develop severe headaches not relieved with medicine. °· You have severe neck pain, especially tenderness in the middle of the back of your neck. °· You have changes in bowel or bladder control. °· There is increasing pain in any area of the body. °· You have shortness of breath, light-headedness, dizziness, or fainting. °· You have chest pain. °· You feel sick to your stomach (nauseous), throw up (vomit), or sweat. °· You have increasing abdominal discomfort. °· There is blood in your urine, stool, or vomit. °· You have pain in your shoulder (shoulder strap areas). °· You feel your symptoms are getting worse. °MAKE SURE YOU: °· Understand these instructions. °· Will watch your condition. °· Will get help right away if you are not doing well or get worse. °Document Released: 12/11/2005 Document Revised: 04/27/2014 Document Reviewed: 05/10/2011 °[employer]® Patient Information ©2015 [employer]. This information is not intended to replace advice given to you by your health care provider. Make sure you discuss any questions you have with your health care provider. ° ° °
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
## 2015-10-05 ENCOUNTER — Emergency Department (HOSPITAL_COMMUNITY)
Admission: EM | Admit: 2015-10-05 | Discharge: 2015-10-05 | Disposition: A | Discharge status: Home / Self Care | Payer: Disability Insurance | Attending: Emergency Medicine | Admitting: Emergency Medicine

## 2015-10-05 ENCOUNTER — Encounter (HOSPITAL_COMMUNITY): Payer: Self-pay | Admitting: Emergency Medicine

## 2015-10-05 ENCOUNTER — Emergency Department (HOSPITAL_COMMUNITY): Payer: Disability Insurance

## 2015-10-05 DIAGNOSIS — R05 Cough: Secondary | ICD-10-CM | POA: Insufficient documentation

## 2015-10-05 DIAGNOSIS — R0789 Other chest pain: Secondary | ICD-10-CM | POA: Insufficient documentation

## 2015-10-05 DIAGNOSIS — R079 Chest pain, unspecified: Secondary | ICD-10-CM

## 2015-10-05 LAB — CBC WITH DIFFERENTIAL/PLATELET
BASOS ABS: 0.1 10*3/uL (ref 0.0–0.1)
Basophils Relative: 0 %
Eosinophils Absolute: 0.3 10*3/uL (ref 0.0–0.7)
Eosinophils Relative: 2 %
HEMATOCRIT: 40.6 % (ref 39.0–52.0)
HEMOGLOBIN: 13.7 g/dL (ref 13.0–17.0)
LYMPHS ABS: 1.7 10*3/uL (ref 0.7–4.0)
LYMPHS PCT: 14 %
MCH: 33.9 pg (ref 26.0–34.0)
MCHC: 33.7 g/dL (ref 30.0–36.0)
MCV: 100.5 fL — AB (ref 78.0–100.0)
Monocytes Absolute: 0.9 10*3/uL (ref 0.1–1.0)
Monocytes Relative: 8 %
NEUTROS ABS: 9 10*3/uL — AB (ref 1.7–7.7)
NEUTROS PCT: 76 %
PLATELETS: 201 10*3/uL (ref 150–400)
RBC: 4.04 MIL/uL — AB (ref 4.22–5.81)
RDW: 13.5 % (ref 11.5–15.5)
WBC: 11.9 10*3/uL — AB (ref 4.0–10.5)

## 2015-10-05 LAB — BASIC METABOLIC PANEL
ANION GAP: 6 (ref 5–15)
BUN: 13 mg/dL (ref 6–20)
CALCIUM: 8.6 mg/dL — AB (ref 8.9–10.3)
CHLORIDE: 107 mmol/L (ref 101–111)
CO2: 26 mmol/L (ref 22–32)
Creatinine, Ser: 1.07 mg/dL (ref 0.61–1.24)
GFR calc non Af Amer: >60 mL/min (ref >60)
GLUCOSE: 112 mg/dL — AB (ref 65–99)
POTASSIUM: 3.9 mmol/L (ref 3.5–5.1)
Sodium: 139 mmol/L (ref 135–145)

## 2015-10-05 LAB — D-DIMER, QUANTITATIVE: D-Dimer, Quant: 0.41 ug/mL-FEU (ref 0.00–0.48)

## 2015-10-05 LAB — TROPONIN I: Troponin I: <0.03 ng/mL (ref <0.031)

## 2015-10-05 MED ORDER — METHOCARBAMOL 750 MG PO TABS: 750.0000 mg | ORAL_TABLET | Freq: Four times a day (QID) | ORAL | Status: AC

## 2015-10-05 MED ORDER — KETOROLAC TROMETHAMINE 30 MG/ML IJ SOLN
30.0000 mg | Freq: Once | INTRAMUSCULAR | Status: AC
  Administered 2015-10-05: 30 mg via INTRAVENOUS
  Filled 2015-10-05: qty 1

## 2015-10-05 NOTE — ED Notes (Signed)
Pt reports mid chest pain that started at 1500 today that happens when he takes a deep breath. Pt had associated SOB and pain in left side of neck and left shoulder when the chest pain occurred. Pt reports he has had chest pain several times before with unknown cause and has undergone stress test that came back normal. Pt also reports "flu like symptoms" including aching and coughing that started yesterday.

## 2015-10-05 NOTE — ED Notes (Signed)
Patient complaining of chest pain radiating into left arm and left side of neck since 1500 today.

## 2015-10-05 NOTE — ED Notes (Signed)
Patient given discharge instruction, verbalized understand. IV removed, band aid applied. Patient ambulatory out of the department.  

## 2015-10-05 NOTE — ED Provider Notes (Signed)
CSN: [id number]     Arrival date & time 10/05/15  1747 History   First MD Initiated Contact with Patient 10/05/15 1801     Chief Complaint  Patient presents with  . Chest Pain     (Consider location/radiation/quality/duration/timing/severity/associated sxs/prior Treatment) HPI Comments: Patient here complaining of chest wall pain that began at 3:00 today that radiates to his neck. Pain characterized as sharp and is positional and worse with deep breathing. Has had recent cold symptoms consisting of cough or congestion and cough is improved with using over-the-counter medications. Denies any leg pain or swelling. Denies any anginal type symptoms. No prior history of PE. Denies any pressure CAD and did have stress test several years ago which he says was negative. His symptoms are better with rest. Denies leg pain or swelling  Patient is a 44 y.o. male presenting with chest pain. The history is provided by the patient.  Chest Pain   Past Medical History  Diagnosis Date  . Back pain    History reviewed. No pertinent past surgical history. History reviewed. No pertinent family history. Social History  Substance Use Topics  . Smoking status: Never Smoker   . Smokeless tobacco: None  . Alcohol Use: Yes     Comment: "a beer or two a day"    Review of Systems  Cardiovascular: Positive for chest pain.  All other systems reviewed and are negative.     Allergies  Review of patient's allergies indicates no known allergies.  Home Medications   Prior to Admission medications   Medication Sig Start Date End Date Taking? Authorizing Provider  acetaminophen (TYLENOL) 500 MG tablet Take 500 mg by mouth every 6 (six) hours as needed for mild pain or moderate pain.   Yes Historical Provider, MD  Pseudoeph-Doxylamine-DM-APAP (NYQUIL MULTI-SYMPTOM PO) Take 2 capsules by mouth at bedtime as needed (for cold/flu symptoms).   Yes Historical Provider, MD   BP 157/94 mmHg  Pulse 70  Temp(Src)  98.1 F (36.7 C) (Oral)  Resp 22  Ht 6' (1.829 m)  Wt 192 lb (87.091 kg)  BMI 26.03 kg/m2  SpO2 100% Physical Exam  Constitutional: He is oriented to person, place, and time. He appears well-developed and well-nourished.  Non-toxic appearance. No distress.  HENT:  Head: Normocephalic and atraumatic.  Eyes: Conjunctivae, EOM and lids are normal. Pupils are equal, round, and reactive to light.  Neck: Normal range of motion. Neck supple. No tracheal deviation present. No thyroid mass present.  Cardiovascular: Normal rate, regular rhythm and normal heart sounds.  Exam reveals no gallop.   No murmur heard. Pulmonary/Chest: Effort normal and breath sounds normal. No stridor. No respiratory distress. He has no decreased breath sounds. He has no wheezes. He has no rhonchi. He has no rales.    Abdominal: Soft. Normal appearance and bowel sounds are normal. He exhibits no distension. There is no tenderness. There is no rebound and no CVA tenderness.  Musculoskeletal: Normal range of motion. He exhibits no edema or tenderness.  Neurological: He is alert and oriented to person, place, and time. He has normal strength. No cranial nerve deficit or sensory deficit. GCS eye subscore is 4. GCS verbal subscore is 5. GCS motor subscore is 6.  Skin: Skin is warm and dry. No abrasion and no rash noted.  Psychiatric: He has a normal mood and affect. His speech is normal and behavior is normal.  Nursing note and vitals reviewed.   ED Course  Procedures (including critical care time)  Labs Review Labs Reviewed  CBC WITH DIFFERENTIAL/PLATELET  TROPONIN I  BASIC METABOLIC PANEL  D-DIMER, QUANTITATIVE (NOT AT [facility])    Imaging Review No results found. I have personally reviewed and evaluated these images and lab results as part of my medical decision-making.   EKG Interpretation   Date/Time:  Tuesday October 05 2015 17:57:31 EDT Ventricular Rate:  68 PR Interval:  193 QRS Duration: 92 QT Interval:   383 QTC Calculation: 407 R Axis:   19 Text Interpretation:  Sinus rhythm Probable left atrial enlargement RSR'  in V1 or V2, right VCD or RVH No significant change since last tracing  Confirmed by Tijuana Scheidegger  MD, Fatin Bachicha ([id number]) on 10/05/2015 6:06:18 PM      MDM   Final diagnoses:  Chest pain    Patient given meds here for chest wall pain and feels better. D-dimer negative troponin negative. Do not think that this represents ACS or PE. Chest pain is reproducible along the chest wall. Stable for discharge    Lorre Nick, MD 10/05/15 2019

## 2015-10-05 NOTE — Discharge Instructions (Signed)

## 2018-12-25 ENCOUNTER — Encounter (HOSPITAL_COMMUNITY): Payer: Self-pay

## 2018-12-25 ENCOUNTER — Emergency Department (HOSPITAL_COMMUNITY): Payer: Self-pay

## 2018-12-25 ENCOUNTER — Emergency Department (HOSPITAL_COMMUNITY)
Admission: EM | Admit: 2018-12-25 | Discharge: 2018-12-25 | Disposition: A | Discharge status: Home / Self Care | Payer: Self-pay | Attending: Emergency Medicine | Admitting: Emergency Medicine

## 2018-12-25 ENCOUNTER — Other Ambulatory Visit: Payer: Self-pay

## 2018-12-25 DIAGNOSIS — Y9389 Activity, other specified: Secondary | ICD-10-CM | POA: Insufficient documentation

## 2018-12-25 DIAGNOSIS — W01198A Fall on same level from slipping, tripping and stumbling with subsequent striking against other object, initial encounter: Secondary | ICD-10-CM | POA: Insufficient documentation

## 2018-12-25 DIAGNOSIS — S0181XA Laceration without foreign body of other part of head, initial encounter: Secondary | ICD-10-CM

## 2018-12-25 DIAGNOSIS — S01511A Laceration without foreign body of lip, initial encounter: Secondary | ICD-10-CM | POA: Insufficient documentation

## 2018-12-25 DIAGNOSIS — Y999 Unspecified external cause status: Secondary | ICD-10-CM | POA: Insufficient documentation

## 2018-12-25 DIAGNOSIS — R55 Syncope and collapse: Secondary | ICD-10-CM | POA: Insufficient documentation

## 2018-12-25 DIAGNOSIS — Y92009 Unspecified place in unspecified non-institutional (private) residence as the place of occurrence of the external cause: Secondary | ICD-10-CM | POA: Insufficient documentation

## 2018-12-25 LAB — CBC WITH DIFFERENTIAL/PLATELET
Abs Immature Granulocytes: 0.04 10*3/uL (ref 0.00–0.07)
Basophils Absolute: 0.1 10*3/uL (ref 0.0–0.1)
Basophils Relative: 1 %
Eosinophils Absolute: 0.2 10*3/uL (ref 0.0–0.5)
Eosinophils Relative: 2 %
HCT: 43.6 % (ref 39.0–52.0)
Hemoglobin: 14.6 g/dL (ref 13.0–17.0)
Immature Granulocytes: 0 %
LYMPHS PCT: 18 %
Lymphs Abs: 1.9 10*3/uL (ref 0.7–4.0)
MCH: 34.3 pg — ABNORMAL HIGH (ref 26.0–34.0)
MCHC: 33.5 g/dL (ref 30.0–36.0)
MCV: 102.3 fL — ABNORMAL HIGH (ref 80.0–100.0)
Monocytes Absolute: 0.8 10*3/uL (ref 0.1–1.0)
Monocytes Relative: 8 %
Neutro Abs: 7.3 10*3/uL (ref 1.7–7.7)
Neutrophils Relative %: 71 %
Platelets: 229 10*3/uL (ref 150–400)
RBC: 4.26 MIL/uL (ref 4.22–5.81)
RDW: 14 % (ref 11.5–15.5)
WBC: 10.3 10*3/uL (ref 4.0–10.5)
nRBC: 0 % (ref 0.0–0.2)

## 2018-12-25 LAB — BASIC METABOLIC PANEL
Anion gap: 7 (ref 5–15)
BUN: 13 mg/dL (ref 6–20)
CO2: 29 mmol/L (ref 22–32)
Calcium: 9.5 mg/dL (ref 8.9–10.3)
Chloride: 105 mmol/L (ref 98–111)
Creatinine, Ser: 1.1 mg/dL (ref 0.61–1.24)
GFR calc Af Amer: >60 mL/min (ref >60)
GFR calc non Af Amer: >60 mL/min (ref >60)
Glucose, Bld: 99 mg/dL (ref 70–99)
Potassium: 3.6 mmol/L (ref 3.5–5.1)
Sodium: 141 mmol/L (ref 135–145)

## 2018-12-25 LAB — CBG MONITORING, ED: Glucose-Capillary: 102 mg/dL — ABNORMAL HIGH (ref 70–99)

## 2018-12-25 MED ORDER — ONDANSETRON HCL 4 MG/2ML IJ SOLN
INTRAMUSCULAR | Status: AC
  Administered 2018-12-25: 4 mg via INTRAVENOUS
  Filled 2018-12-25: qty 2

## 2018-12-25 MED ORDER — AMOXICILLIN 500 MG PO CAPS: 500.0000 mg | ORAL_CAPSULE | Freq: Three times a day (TID) | ORAL | 0 refills | Status: AC

## 2018-12-25 MED ORDER — LIDOCAINE HCL (PF) 2 % IJ SOLN
INTRAMUSCULAR | Status: AC
  Administered 2018-12-25: 5 mL
  Filled 2018-12-25: qty 10

## 2018-12-25 MED ORDER — LIDOCAINE HCL (PF) 2 % IJ SOLN
INTRAMUSCULAR | Status: AC
  Filled 2018-12-25: qty 10

## 2018-12-25 MED ORDER — LIDOCAINE HCL (PF) 2 % IJ SOLN: 10.0000 mL | Freq: Once | INTRAMUSCULAR | Status: DC

## 2018-12-25 MED ORDER — HYDROCODONE-ACETAMINOPHEN 5-325 MG PO TABS: 1.0000 | ORAL_TABLET | ORAL | 0 refills | Status: AC | PRN

## 2018-12-25 MED ORDER — ONDANSETRON HCL 4 MG/2ML IJ SOLN
4.0000 mg | Freq: Once | INTRAMUSCULAR | Status: AC
  Administered 2018-12-25: 4 mg via INTRAVENOUS

## 2018-12-25 MED ORDER — AMOXICILLIN 250 MG PO CAPS
500.0000 mg | ORAL_CAPSULE | Freq: Once | ORAL | Status: AC
  Administered 2018-12-25: 500 mg via ORAL
  Filled 2018-12-25: qty 2

## 2018-12-25 MED ORDER — SODIUM CHLORIDE 0.9 % IV BOLUS
1000.0000 mL | Freq: Once | INTRAVENOUS | Status: AC
  Administered 2018-12-25: 1000 mL via INTRAVENOUS

## 2018-12-25 NOTE — ED Notes (Signed)
Patient transported to X-ray 

## 2018-12-25 NOTE — ED Notes (Signed)
While RN in room with pt, pt started to complain of n/v and feeling "hot" states that this is what happened before the syncopal episode at home, pt remains NSR on monitor, Dr Blinda Leatherwood notified, additional orders given,

## 2018-12-25 NOTE — ED Provider Notes (Signed)
[facility] EMERGENCY DEPARTMENT Provider Note   CSN: [id number] Arrival date & time: 12/25/18  0011     History   Chief Complaint Chief Complaint  Patient presents with  . Near Syncope    fall    HPI Trevor Hicks is a 48 y.o. male.  Patient presents to the emergency department for evaluation after a fall.  Patient reports that he was sitting at his table in his kitchen eating when he started to feel cold.  He turned the heat on and then after period of time started to notice that he was feeling hot began to sweat.  He stood up to go and turn off that he any became very dizzy, felt like he was going to pass out and then fell forward to the floor.  Patient complains of headache and facial pain after the fall.  He reports that he had been doing well all day today.  No recent illness or flu.  No vomiting or diarrhea.  He has not had any chest pain, heart palpitations, shortness of breath.     Past Medical History:  Diagnosis Date  . Back pain     There are no active problems to display for this patient.   History reviewed. No pertinent surgical history.      Home Medications    Prior to Admission medications   Medication Sig Start Date End Date Taking? Authorizing Provider  acetaminophen (TYLENOL) 500 MG tablet Take 500 mg by mouth every 6 (six) hours as needed for mild pain or moderate pain.    [provider]  amoxicillin (AMOXIL) 500 MG capsule Take 1 capsule (500 mg total) by mouth 3 (three) times daily. 12/25/18   Gilda Crease, MD  methocarbamol (ROBAXIN-750) 750 MG tablet Take 1 tablet (750 mg total) by mouth 4 (four) times daily. 10/05/15   Lorre Nick, MD  Pseudoeph-Doxylamine-DM-APAP (NYQUIL MULTI-SYMPTOM PO) Take 2 capsules by mouth at bedtime as needed (for cold/flu symptoms).    [provider]    Family History No family history on file.  Social History Social History   Tobacco Use  . Smoking status: Never Smoker  .  Smokeless tobacco: Never Used  Substance Use Topics  . Alcohol use: Yes    Comment: "a beer or two a day"  . Drug use: No     Allergies   Patient has no known allergies.   Review of Systems Review of Systems  Skin: Positive for wound.  Neurological: Positive for dizziness, syncope and headaches.  All other systems reviewed and are negative.    Physical Exam Updated Vital Signs Ht 6' (1.829 m)   Wt 81.6 kg   BMI 24.41 kg/m   Physical Exam Vitals signs and nursing note reviewed.  Constitutional:      General: He is not in acute distress.    Appearance: Normal appearance. He is well-developed.  HENT:     Head: Normocephalic. Laceration (upper lip, lower lip) present.     Right Ear: Hearing normal.     Left Ear: Hearing normal.     Nose: Nose normal.     Mouth/Throat:     Mouth: Lacerations (minimal, gingiva left upper central incisor) present.     Comments: No fractured or loose teeth Eyes:     Conjunctiva/sclera: Conjunctivae normal.     Pupils: Pupils are equal, round, and reactive to light.  Neck:     Musculoskeletal: Normal range of motion and neck supple.  Cardiovascular:  Rate and Rhythm: Regular rhythm.     Heart sounds: S1 normal and S2 normal. No murmur. No friction rub. No gallop.   Pulmonary:     Effort: Pulmonary effort is normal. No respiratory distress.     Breath sounds: Normal breath sounds.  Chest:     Chest wall: No tenderness.  Abdominal:     General: Bowel sounds are normal.     Palpations: Abdomen is soft.     Tenderness: There is no abdominal tenderness. There is no guarding or rebound. Negative signs include Murphy's sign and McBurney's sign.     Hernia: No hernia is present.  Musculoskeletal: Normal range of motion.  Skin:    General: Skin is warm and dry.     Findings: No rash.  Neurological:     Mental Status: He is alert and oriented to person, place, and time.     GCS: GCS eye subscore is 4. GCS verbal subscore is 5. GCS  motor subscore is 6.     Cranial Nerves: No cranial nerve deficit.     Sensory: No sensory deficit.     Coordination: Coordination normal.  Psychiatric:        Speech: Speech normal.        Behavior: Behavior normal.        Thought Content: Thought content normal.      ED Treatments / Results  Labs (all labs ordered are listed, but only abnormal results are displayed) Labs Reviewed  CBC WITH DIFFERENTIAL/PLATELET - Abnormal; Notable for the following components:      Result Value   MCV 102.3 (*)    MCH 34.3 (*)    All other components within normal limits  CBG MONITORING, ED - Abnormal; Notable for the following components:   Glucose-Capillary 102 (*)    All other components within normal limits  BASIC METABOLIC PANEL    EKG EKG Interpretation  Date/Time:  Wednesday December 25 2018 00:25:12 EST Ventricular Rate:  60 PR Interval:    QRS Duration: 105 QT Interval:  399 QTC Calculation: 399 R Axis:   40 Text Interpretation:  Sinus rhythm Prolonged PR interval Left atrial enlargement RSR' in V1 or V2, right VCD or RVH Minimal ST elevation, anterior leads Confirmed by Gilda Crease [phone]) on 12/25/2018 1:08:21 AM   Radiology Ct Head Wo Contrast  Result Date: 12/25/2018 CLINICAL DATA:  Acute onset of near-syncope. Larey Seat face first on kitchen floor. Dizziness. Laceration and abrasion at the lips. Initial encounter. EXAM: CT HEAD WITHOUT CONTRAST CT MAXILLOFACIAL WITHOUT CONTRAST TECHNIQUE: Multidetector CT imaging of the head and maxillofacial structures were performed using the standard protocol without intravenous contrast. Multiplanar CT image reconstructions of the maxillofacial structures were also generated. COMPARISON:  None. FINDINGS: CT HEAD FINDINGS Brain: No evidence of acute infarction, hemorrhage, hydrocephalus, extra-axial collection or mass lesion/mass effect. The posterior fossa, including the cerebellum, brainstem and fourth ventricle, is within normal  limits. The third and lateral ventricles, and basal ganglia are unremarkable in appearance. The cerebral hemispheres are symmetric in appearance, with normal gray-white differentiation. No mass effect or midline shift is seen. Vascular: No hyperdense vessel or unexpected calcification. Skull: There is no evidence of fracture; visualized osseous structures are unremarkable in appearance. Other: No significant soft tissue abnormalities are seen. CT MAXILLOFACIAL FINDINGS Osseous: There is no evidence of fracture or dislocation. The maxilla and mandible appear intact. The nasal bone is unremarkable in appearance. Multiple maxillary and mandibular dental caries are noted. Orbits: The orbits  are intact bilaterally. Sinuses: A mucus retention cyst or polyp is noted at the left maxillary sinus. The remaining visualized paranasal sinuses and mastoid air cells are well-aerated. Soft tissues: A prominent soft tissue laceration is noted at the lower lip, with associated high-density debris. The parapharyngeal fat planes are preserved. The nasopharynx, oropharynx and hypopharynx are unremarkable in appearance. The visualized portions of the valleculae and piriform sinuses are grossly unremarkable. The parotid and submandibular glands are within normal limits. No cervical lymphadenopathy is seen. IMPRESSION: 1. No evidence of traumatic intracranial injury or fracture. 2. No evidence of fracture or dislocation with regard to the maxillofacial structures. 3. Prominent soft tissue laceration at the lower lip, with associated high-density debris. 4. Multiple maxillary and mandibular dental caries noted. 5. Mucus retention cyst or polyp at the left maxillary sinus. Electronically Signed   By: Roanna RaiderJeffery  Chang M.D.   On: 12/25/2018 01:14   Ct Maxillofacial Wo Contrast  Result Date: 12/25/2018 CLINICAL DATA:  Acute onset of near-syncope. Larey SeatFell face first on kitchen floor. Dizziness. Laceration and abrasion at the lips. Initial  encounter. EXAM: CT HEAD WITHOUT CONTRAST CT MAXILLOFACIAL WITHOUT CONTRAST TECHNIQUE: Multidetector CT imaging of the head and maxillofacial structures were performed using the standard protocol without intravenous contrast. Multiplanar CT image reconstructions of the maxillofacial structures were also generated. COMPARISON:  None. FINDINGS: CT HEAD FINDINGS Brain: No evidence of acute infarction, hemorrhage, hydrocephalus, extra-axial collection or mass lesion/mass effect. The posterior fossa, including the cerebellum, brainstem and fourth ventricle, is within normal limits. The third and lateral ventricles, and basal ganglia are unremarkable in appearance. The cerebral hemispheres are symmetric in appearance, with normal gray-white differentiation. No mass effect or midline shift is seen. Vascular: No hyperdense vessel or unexpected calcification. Skull: There is no evidence of fracture; visualized osseous structures are unremarkable in appearance. Other: No significant soft tissue abnormalities are seen. CT MAXILLOFACIAL FINDINGS Osseous: There is no evidence of fracture or dislocation. The maxilla and mandible appear intact. The nasal bone is unremarkable in appearance. Multiple maxillary and mandibular dental caries are noted. Orbits: The orbits are intact bilaterally. Sinuses: A mucus retention cyst or polyp is noted at the left maxillary sinus. The remaining visualized paranasal sinuses and mastoid air cells are well-aerated. Soft tissues: A prominent soft tissue laceration is noted at the lower lip, with associated high-density debris. The parapharyngeal fat planes are preserved. The nasopharynx, oropharynx and hypopharynx are unremarkable in appearance. The visualized portions of the valleculae and piriform sinuses are grossly unremarkable. The parotid and submandibular glands are within normal limits. No cervical lymphadenopathy is seen. IMPRESSION: 1. No evidence of traumatic intracranial injury or  fracture. 2. No evidence of fracture or dislocation with regard to the maxillofacial structures. 3. Prominent soft tissue laceration at the lower lip, with associated high-density debris. 4. Multiple maxillary and mandibular dental caries noted. 5. Mucus retention cyst or polyp at the left maxillary sinus. Electronically Signed   By: Roanna RaiderJeffery  Chang M.D.   On: 12/25/2018 01:14    Procedures .Marland Kitchen.Laceration Repair Date/Time: 12/25/2018 2:38 AM Performed by: Gilda CreasePollina, Christopher J, MD Authorized by: Gilda CreasePollina, Christopher J, MD   Consent:    Consent obtained:  Verbal   Consent given by:  Patient   Risks discussed:  Infection, pain and poor cosmetic result Universal protocol:    Procedure explained and questions answered to patient or proxy's satisfaction: yes     Relevant documents present and verified: yes     Test results available and properly labeled:  yes     Imaging studies available: yes     Required blood products, implants, devices, and special equipment available: yes     Site/side marked: yes     Immediately prior to procedure, a time out was called: yes     Patient identity confirmed:  Verbally with patient Anesthesia (see MAR for exact dosages):    Anesthesia method:  Local infiltration   Local anesthetic:  Lidocaine 2% w/o epi Laceration details:    Location:  Lip   Lip location:  Upper exterior lip   Length (cm):  1 Repair type:    Repair type:  Simple Pre-procedure details:    Preparation:  Patient was prepped and draped in usual sterile fashion and imaging obtained to evaluate for foreign bodies Exploration:    Contaminated: no   Treatment:    Area cleansed with:  Betadine   Irrigation solution:  Sterile saline   Irrigation method:  Syringe Skin repair:    Repair method:  Sutures   Suture size:  6-0   Suture material:  Fast-absorbing gut   Number of sutures:  2 Approximation:    Approximation:  Close   Vermilion border well-aligned: n/a.   Post-procedure details:      Dressing:  Open (no dressing)   Patient tolerance of procedure:  Tolerated well, no immediate complications .Marland Kitchen.Laceration Repair Date/Time: 12/25/2018 2:39 AM Performed by: Gilda CreasePollina, Christopher J, MD Authorized by: Gilda CreasePollina, Christopher J, MD   Consent:    Consent obtained:  Verbal   Consent given by:  Patient   Risks discussed:  Infection, pain and poor cosmetic result Universal protocol:    Procedure explained and questions answered to patient or proxy's satisfaction: yes     Relevant documents present and verified: yes     Test results available and properly labeled: yes     Imaging studies available: yes     Required blood products, implants, devices, and special equipment available: yes     Site/side marked: yes     Immediately prior to procedure, a time out was called: yes     Patient identity confirmed:  Verbally with patient Anesthesia (see MAR for exact dosages):    Anesthesia method:  Local infiltration   Local anesthetic:  Lidocaine 2% w/o epi Laceration details:    Location:  Lip   Lip location:  Lower exterior lip   Length (cm):  1 Pre-procedure details:    Preparation:  Patient was prepped and draped in usual sterile fashion and imaging obtained to evaluate for foreign bodies Exploration:    Contaminated: no   Treatment:    Area cleansed with:  Betadine   Irrigation solution:  Sterile saline   Irrigation method:  Syringe Skin repair:    Repair method:  Sutures   Suture size:  6-0   Suture material:  Fast-absorbing gut   Number of sutures:  3 Approximation:    Approximation:  Close   Vermilion border: well-aligned   Post-procedure details:    Dressing:  Open (no dressing)   Patient tolerance of procedure:  Tolerated well, no immediate complications   (including critical care time)  Medications Ordered in ED Medications  lidocaine (XYLOCAINE) 2 % injection 10 mL (has no administration in time range)  lidocaine (XYLOCAINE) 2 % injection (has no  administration in time range)  ondansetron (ZOFRAN) injection 4 mg (4 mg Intravenous Given 12/25/18 0040)  sodium chloride 0.9 % bolus 1,000 mL (1,000 mLs Intravenous New Bag/Given 12/25/18 0040)  Initial Impression / Assessment and Plan / ED Course  I have reviewed the triage vital signs and the nursing notes.  Pertinent labs & imaging results that were available during my care of the patient were reviewed by me and considered in my medical decision making (see chart for details).     Patient presented to the emergency department after what sounds like a syncopal episode.  Patient had onset of feeling cold while he was eating his dinner.  He turned up that he in the kitchen and then started to feel hot.  He noticed that he was having some sweating on his forehead and upper body, got up to turn on the air conditioning and became acutely dizzy and then fell forward, thinks he briefly passed out.  Patient did not have any chest pain or shortness of breath.  Here in the ER he is back to his normal baseline, only complains of headache and facial pain.  He had not recently been ill.  Etiology of the syncope unclear, but he does not have any red flags.  [place] syncope score negative, low risk for serious outcome.  Patient appropriate for discharge and outpatient follow-up.  Patient had a laceration on his upper and lower lip.  This appears to be secondary to injury from a tooth.  No dental injury noted.  Upper laceration did not involve vermilion border.  This was repaired with absorbable sutures.  Lower lip laceration did involve vermilion border which was carefully approximated.  Will empirically cover with antibiotics.  Final Clinical Impressions(s) / ED Diagnoses   Final diagnoses:  Syncope, unspecified syncope type  Facial laceration, initial encounter    ED Discharge Orders         Ordered    amoxicillin (AMOXIL) 500 MG capsule  3 times daily     12/25/18 0245             Gilda Crease, MD 12/25/18 [phone]

## 2018-12-25 NOTE — ED Notes (Signed)
ED Provider at bedside. 

## 2018-12-25 NOTE — ED Triage Notes (Signed)
Pt reports near syncope while in kitchen about 3-4 hours ago. Pt reports  getting hot, then cold, dizzy and fell face first in kitchen floor. Laceration noted to pt lip and abrasion to upper lip.

## 2018-12-26 MED FILL — Hydrocodone-Acetaminophen Tab 5-325 MG: ORAL | Qty: 6 | Status: AC

## 2018-12-26 NOTE — ED Notes (Signed)
On 12/26/2018 pt called and asked about his antibiotics. PT told the antibiotics were filled and ready at [facility] in [place] and he wouldn't have received a paper copy for the prescription.

## 2018-12-29 ENCOUNTER — Other Ambulatory Visit: Payer: Self-pay

## 2018-12-29 ENCOUNTER — Emergency Department (HOSPITAL_COMMUNITY)
Admission: EM | Admit: 2018-12-29 | Discharge: 2018-12-29 | Disposition: A | Discharge status: Home / Self Care | Payer: Self-pay | Attending: Emergency Medicine | Admitting: Emergency Medicine

## 2018-12-29 ENCOUNTER — Encounter (HOSPITAL_COMMUNITY): Payer: Self-pay | Admitting: Emergency Medicine

## 2018-12-29 DIAGNOSIS — S01511D Laceration without foreign body of lip, subsequent encounter: Secondary | ICD-10-CM | POA: Insufficient documentation

## 2018-12-29 DIAGNOSIS — X58XXXD Exposure to other specified factors, subsequent encounter: Secondary | ICD-10-CM | POA: Insufficient documentation

## 2018-12-29 DIAGNOSIS — Z79899 Other long term (current) drug therapy: Secondary | ICD-10-CM | POA: Insufficient documentation

## 2018-12-29 NOTE — ED Provider Notes (Signed)
[facility] EMERGENCY DEPARTMENT Provider Note   CSN: [id number] Arrival date & time: 12/29/18  1422     History   Chief Complaint Chief Complaint  Patient presents with  . Wound Check    HPI Trevor Hicks is a 48 y.o. male.  Recheck of the lower midline lip laceration from 12/25/2018.  Apparently the stitches popped out and patient is concerned about the laceration site.  No other concerns.     Past Medical History:  Diagnosis Date  . Back pain     There are no active problems to display for this patient.   History reviewed. No pertinent surgical history.      Home Medications    Prior to Admission medications   Medication Sig Start Date End Date Taking? Authorizing Provider  acetaminophen (TYLENOL) 500 MG tablet Take 500 mg by mouth every 6 (six) hours as needed for mild pain or moderate pain.    [provider]  amoxicillin (AMOXIL) 500 MG capsule Take 1 capsule (500 mg total) by mouth 3 (three) times daily. 12/25/18   Gilda Crease, MD  HYDROcodone-acetaminophen (NORCO/VICODIN) 5-325 MG tablet Take 1-2 tablets by mouth every 4 (four) hours as needed. 12/25/18   Gilda Crease, MD  methocarbamol (ROBAXIN-750) 750 MG tablet Take 1 tablet (750 mg total) by mouth 4 (four) times daily. 10/05/15   Lorre Nick, MD  Pseudoeph-Doxylamine-DM-APAP (NYQUIL MULTI-SYMPTOM PO) Take 2 capsules by mouth at bedtime as needed (for cold/flu symptoms).    [provider]    Family History History reviewed. No pertinent family history.  Social History Social History   Tobacco Use  . Smoking status: Never Smoker  . Smokeless tobacco: Never Used  Substance Use Topics  . Alcohol use: Yes    Comment: "a beer or two a day"  . Drug use: No     Allergies   Patient has no known allergies.   Review of Systems Review of Systems  All other systems reviewed and are negative.    Physical Exam Updated Vital Signs BP (!) 167/100   Pulse 73    Temp 98.5 F (36.9 C) (Oral)   Resp 16   Ht 6' (1.829 m)   Wt 81.6 kg   SpO2 100%   BMI 24.41 kg/m   Physical Exam Vitals signs and nursing note reviewed.  Constitutional:      Appearance: He is well-developed.  HENT:     Head: Normocephalic.     Comments: Lower lip: Vertical scar in midline slightly separated.  Good granulation tissue and scabbing.  No further repair required. Eyes:     Conjunctiva/sclera: Conjunctivae normal.  Neck:     Musculoskeletal: Neck supple.  Musculoskeletal: Normal range of motion.  Skin:    General: Skin is warm and dry.  Neurological:     Mental Status: He is alert and oriented to person, place, and time.  Psychiatric:        Behavior: Behavior normal.      ED Treatments / Results  Labs (all labs ordered are listed, but only abnormal results are displayed) Labs Reviewed - No data to display  EKG None  Radiology No results found.  Procedures Procedures (including critical care time)  Medications Ordered in ED Medications - No data to display   Initial Impression / Assessment and Plan / ED Course  I have reviewed the triage vital signs and the nursing notes.  Pertinent labs & imaging results that were available during my  care of the patient were reviewed by me and considered in my medical decision making (see chart for details).     Recheck of lower left leg.  No further repair.  Recommended Vaseline ointment.  Final Clinical Impressions(s) / ED Diagnoses   Final diagnoses:  Lip laceration, subsequent encounter    ED Discharge Orders    None       Donnetta Hutching, MD 12/29/18 1742

## 2018-12-29 NOTE — Discharge Instructions (Addendum)
Can take 4 ibuprofen tablets 3 times a day.  In between doses of ibuprofen, take 2 extra strength Tylenol.  Apply Vaseline ointment to lips.

## 2018-12-29 NOTE — ED Notes (Signed)
Pt here 1./1 for fall   CT of head face  Now complains of swelling to lower jaw and discomfort  Here for eval

## 2018-12-29 NOTE — ED Triage Notes (Signed)
Pt states here due to pain in lip from stitches. Pt states hurts to open mouth.denies other symptoms.

## 2019-12-29 ENCOUNTER — Other Ambulatory Visit: Payer: Self-pay

## 2019-12-31 ENCOUNTER — Ambulatory Visit: Payer: Self-pay | Attending: Internal Medicine

## 2019-12-31 ENCOUNTER — Other Ambulatory Visit: Payer: Self-pay

## 2019-12-31 DIAGNOSIS — Z20822 Contact with and (suspected) exposure to covid-19: Secondary | ICD-10-CM | POA: Insufficient documentation

## 2020-01-01 LAB — NOVEL CORONAVIRUS, NAA: SARS-CoV-2, NAA: NOT DETECTED

## 2020-01-03 ENCOUNTER — Telehealth: Payer: Self-pay | Admitting: General Practice

## 2020-01-03 NOTE — Telephone Encounter (Signed)
Negative COVID results given. Patient results "NOT Detected." Caller expressed understanding. ° °

## 2020-02-28 IMAGING — CT CT HEAD W/O CM
3 of 6 series · 14 of 47 positions shown, 17 images · non-contrast
Comparison: None.

CLINICAL DATA: Acute onset of near-syncope. Fell face first on
kitchen floor. Dizziness. Laceration and abrasion at the lips.
Initial encounter.

EXAM:
CT HEAD WITHOUT CONTRAST
CT MAXILLOFACIAL WITHOUT CONTRAST
TECHNIQUE: Multidetector CT imaging of the head and maxillofacial structures
were performed using the standard protocol without intravenous
contrast. Multiplanar CT image reconstructions of the maxillofacial
structures were also generated.

[Series 4: coronal soft tissue · coronal · 0.32mm/px · 3 of 67 slices shown]
[im 17/67  brain]
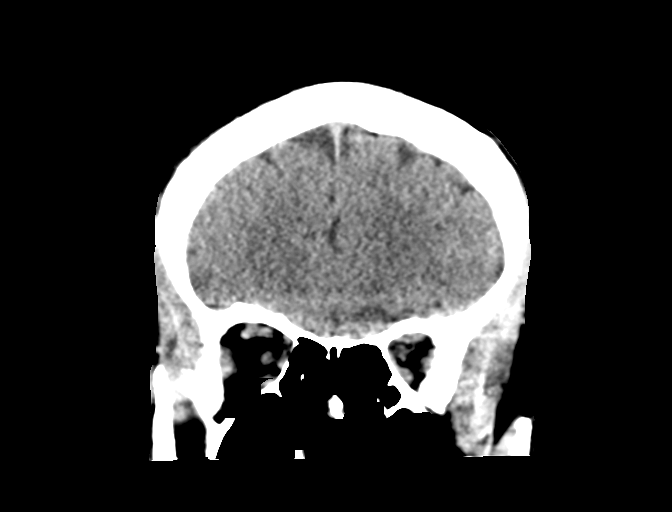
[im 34/67  brain]
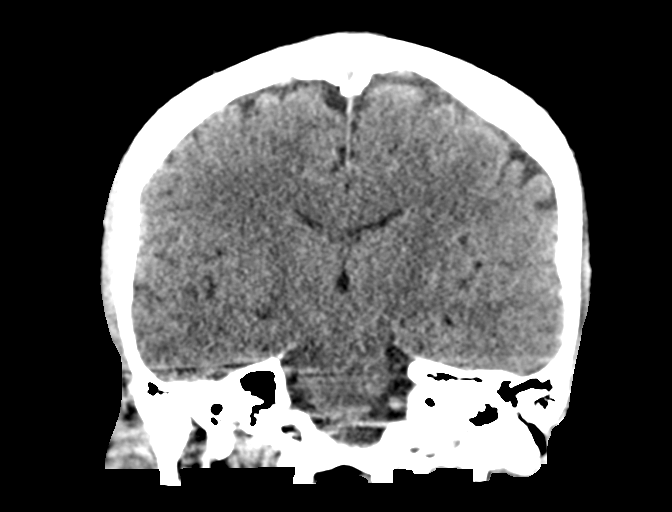
[im 50/67  brain]
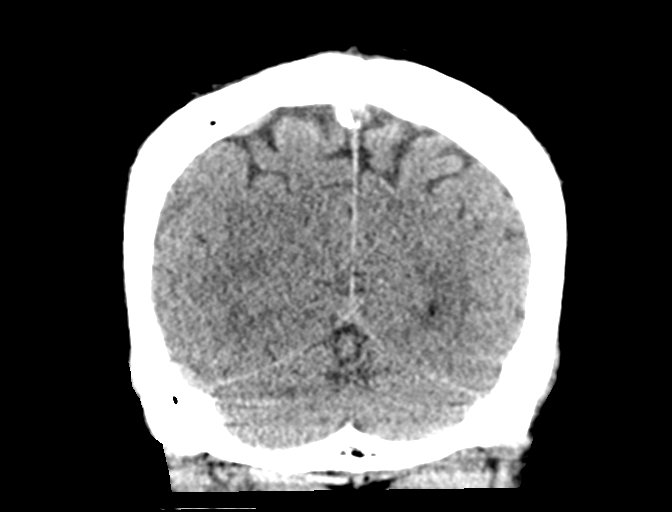

[Series 6: max soft · axial · 0.38mm/px · z∈[+1492,+1650]mm · 9 of 97 slices shown, 12 images]
[im 9/97  brain]
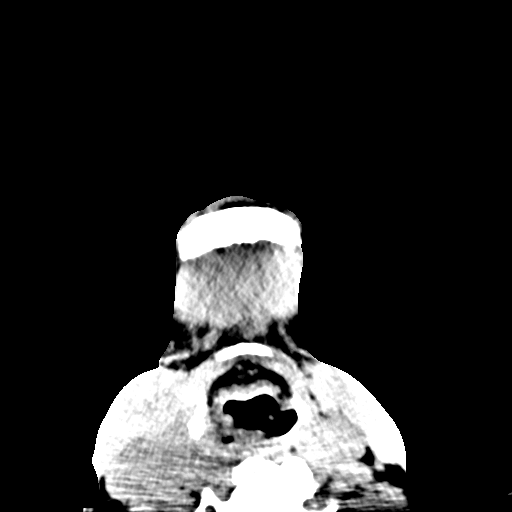
[im 9/97  bone]
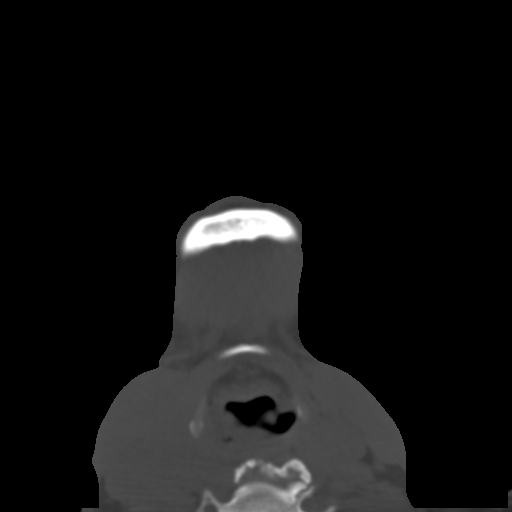
[im 18/97  brain]
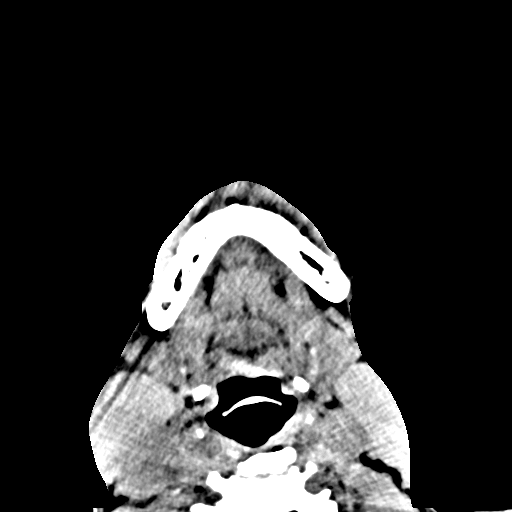
[im 27/97  brain]
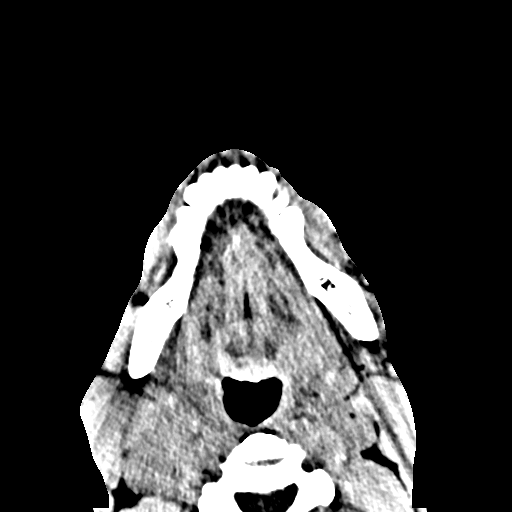
[im 40/97  brain]
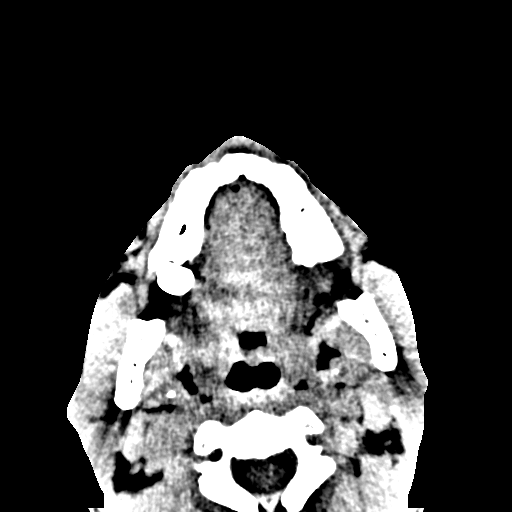
[im 49/97  brain]
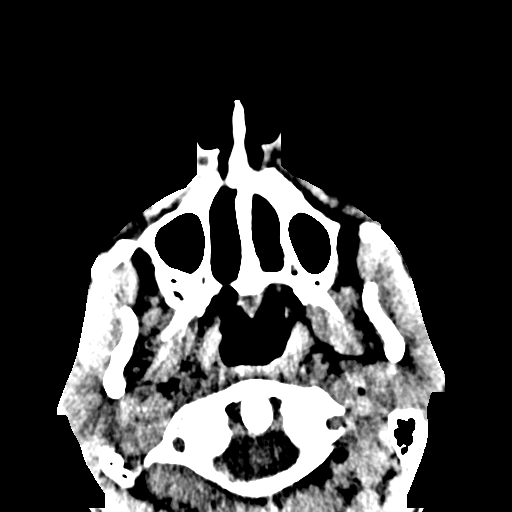
[im 49/97  bone]
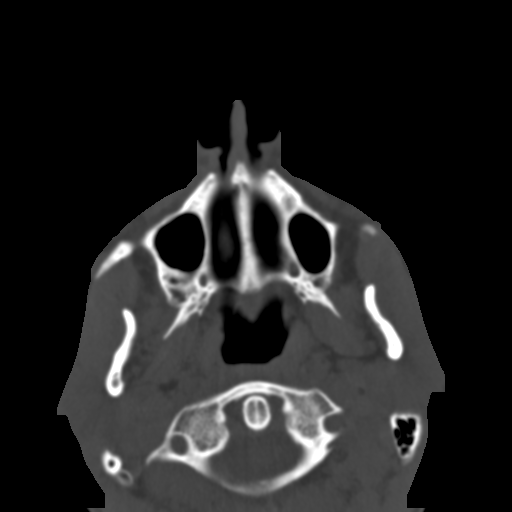
[im 57/97  brain]
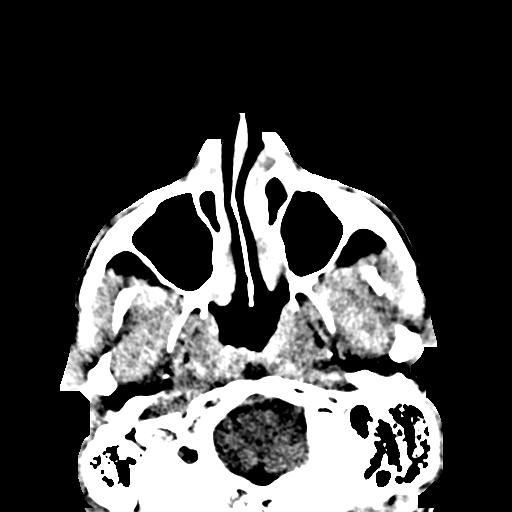
[im 70/97  brain]
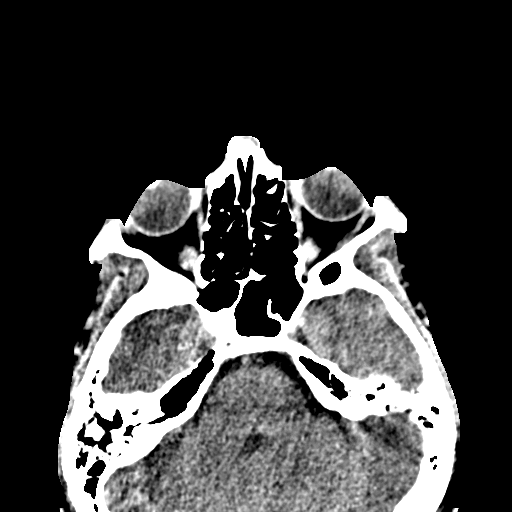
[im 79/97  brain]
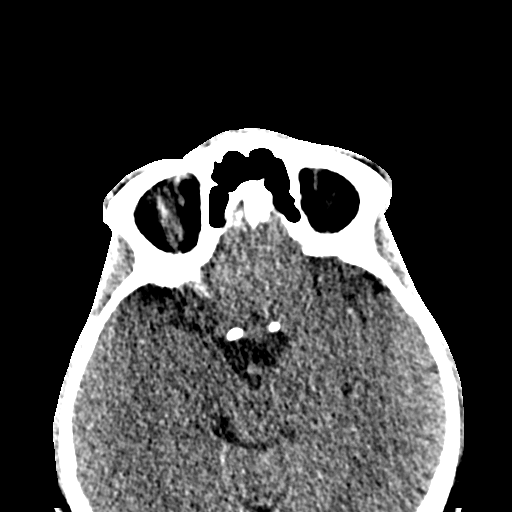
[im 88/97  brain]
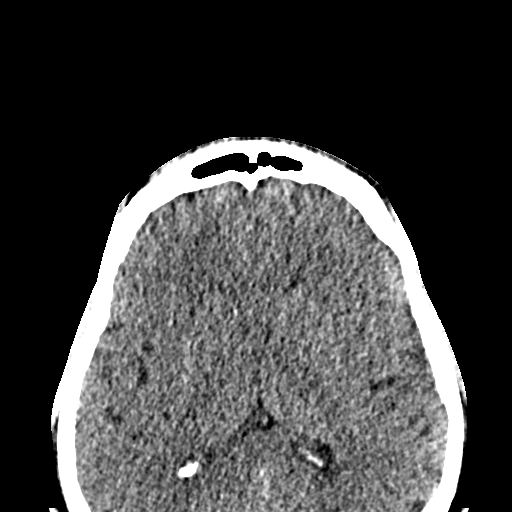
[im 88/97  bone]
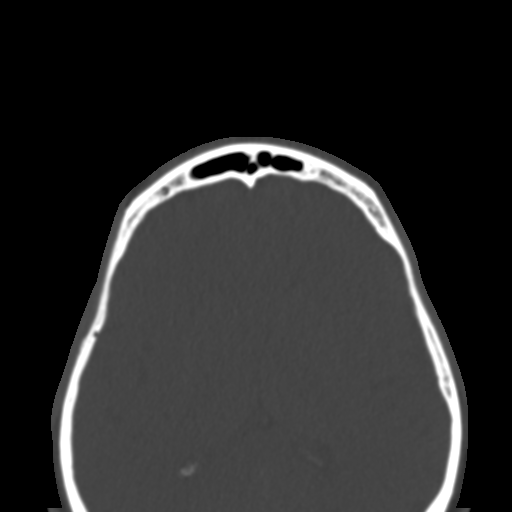

[Series 11: sagittal soft · sagittal · 0.43mm/px · 2 of 84 slices shown]
[im 28/84  brain]
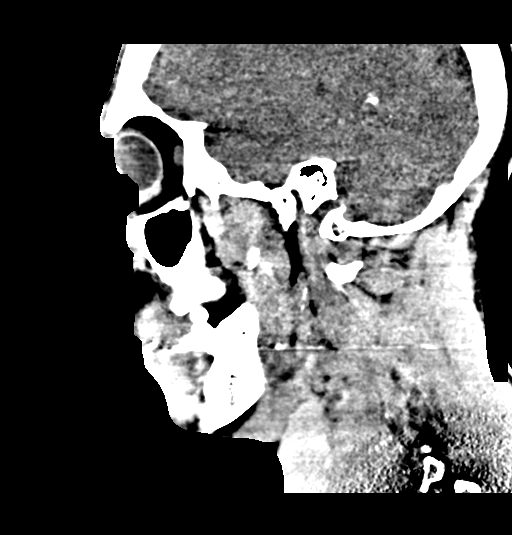
[im 56/84  brain]
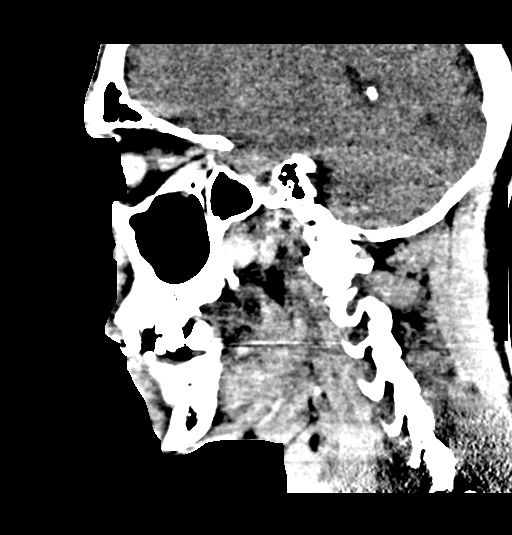

[14 of 47 positions shown; findings below may reference images not displayed]

FINDINGS: CT HEAD FINDINGS

Brain:

No evidence of acute infarction, hemorrhage, hydrocephalus,
extra-axial collection or mass lesion/mass effect.

The posterior fossa, including the cerebellum, brainstem and fourth
ventricle, is within normal limits. The third and lateral
ventricles, and basal ganglia are unremarkable in appearance. The
cerebral hemispheres are symmetric in appearance, with normal
gray-white differentiation. No mass effect or midline shift is seen.

Vascular: No hyperdense vessel or unexpected calcification.

Skull: There is no evidence of fracture; visualized osseous
structures are unremarkable in appearance.

Other: No significant soft tissue abnormalities are seen.

CT MAXILLOFACIAL FINDINGS

Osseous: There is no evidence of fracture or dislocation. The
maxilla and mandible appear intact. The nasal bone is unremarkable
in appearance. Multiple maxillary and mandibular dental caries are
noted.

Orbits: The orbits are intact bilaterally.

Sinuses: A mucus retention cyst or polyp is noted at the left
maxillary sinus. The remaining visualized paranasal sinuses and
mastoid air cells are well-aerated.

Soft tissues: A prominent soft tissue laceration is noted at the
lower lip, with associated high-density debris. The parapharyngeal
fat planes are preserved. The nasopharynx, oropharynx and
hypopharynx are unremarkable in appearance. The visualized portions
of the valleculae and piriform sinuses are grossly unremarkable.

The parotid and submandibular glands are within normal limits. No
cervical lymphadenopathy is seen.
IMPRESSION: 1. No evidence of traumatic intracranial injury or fracture.
2. No evidence of fracture or dislocation with regard to the
maxillofacial structures.
3. Prominent soft tissue laceration at the lower lip, with
associated high-density debris.
4. Multiple maxillary and mandibular dental caries noted.
5. Mucus retention cyst or polyp at the left maxillary sinus.

## 2021-10-30 ENCOUNTER — Emergency Department (HOSPITAL_COMMUNITY)
Admission: EM | Admit: 2021-10-30 | Discharge: 2021-10-30 | Disposition: A | Discharge status: Home / Self Care | Payer: Self-pay | Attending: Emergency Medicine | Admitting: Emergency Medicine

## 2021-10-30 ENCOUNTER — Encounter (HOSPITAL_COMMUNITY): Payer: Self-pay | Admitting: *Deleted

## 2021-10-30 DIAGNOSIS — R1031 Right lower quadrant pain: Secondary | ICD-10-CM | POA: Insufficient documentation

## 2021-10-30 DIAGNOSIS — R1032 Left lower quadrant pain: Secondary | ICD-10-CM | POA: Insufficient documentation

## 2021-10-30 NOTE — ED Provider Notes (Signed)
[facility] EMERGENCY DEPARTMENT Provider Note   CSN: [id number] Arrival date & time: 10/30/21  1510     History Chief Complaint  Patient presents with   Abdominal Pain    Trevor Hicks is a 50 y.o. male.   Abdominal Pain Associated symptoms: no fever    This patient is a 50 year old male with no significant chronic medical history presenting to the hospital with a complaint of bilateral inguinal pain and lumps, he states that he feels this more when he was sitting or when he is laying on his stomach, and seems to go away when he stands up.  He has not had any constipation and denies any dysuria or penile discharge or rashes.  He states he has had an unprotected sexual encounter with a new partner and was concerned that she may have given him something but he has no other symptoms.  He is primarily concerned because he has had chlamydia in the past with no other symptoms other than this mild lump in the lower pelvis.  Symptoms of been present for 2 weeks, nothing seems to make this go away, no associated fevers or chills  Past Medical History:  Diagnosis Date   Back pain     There are no problems to display for this patient.   History reviewed. No pertinent surgical history.     No family history on file.  Social History   Tobacco Use   Smoking status: Never   Smokeless tobacco: Never  Substance Use Topics   Alcohol use: Yes    Comment: "a beer or two a day"   Drug use: Yes    Types: Marijuana    Comment: holidays    Home Medications Prior to Admission medications   Medication Sig Start Date End Date Taking? Authorizing Provider  acetaminophen (TYLENOL) 500 MG tablet Take 500 mg by mouth every 6 (six) hours as needed for mild pain or moderate pain.    [provider]  amoxicillin (AMOXIL) 500 MG capsule Take 1 capsule (500 mg total) by mouth 3 (three) times daily. 12/25/18   Gilda Crease, MD  HYDROcodone-acetaminophen (NORCO/VICODIN) 5-325 MG  tablet Take 1-2 tablets by mouth every 4 (four) hours as needed. 12/25/18   Gilda Crease, MD  methocarbamol (ROBAXIN-750) 750 MG tablet Take 1 tablet (750 mg total) by mouth 4 (four) times daily. 10/05/15   Lorre Nick, MD  Pseudoeph-Doxylamine-DM-APAP (NYQUIL MULTI-SYMPTOM PO) Take 2 capsules by mouth at bedtime as needed (for cold/flu symptoms).    [provider]    Allergies    Patient has no known allergies.  Review of Systems   Review of Systems  Constitutional:  Negative for fever.  Gastrointestinal:  Positive for abdominal pain.  Endocrine: Negative for polyuria.  Genitourinary:  Negative for flank pain, genital sores, penile discharge and penile pain.  Musculoskeletal:  Positive for back pain (chronic).  Hematological:  Negative for adenopathy.   Physical Exam Updated Vital Signs BP (!) 149/94 (BP Location: Right Arm)   Pulse 65   Temp 98.1 F (36.7 C) (Oral)   Resp 14   SpO2 100%   Physical Exam Vitals and nursing note reviewed.  Constitutional:      General: He is not in acute distress.    Appearance: He is well-developed.  HENT:     Head: Normocephalic and atraumatic.     Mouth/Throat:     Pharynx: No oropharyngeal exudate.  Eyes:     General: No scleral  icterus.       Right eye: No discharge.        Left eye: No discharge.     Conjunctiva/sclera: Conjunctivae normal.     Pupils: Pupils are equal, round, and reactive to light.  Neck:     Thyroid: No thyromegaly.     Vascular: No JVD.  Cardiovascular:     Rate and Rhythm: Normal rate and regular rhythm.     Heart sounds: Normal heart sounds. No murmur heard.   No friction rub. No gallop.  Pulmonary:     Effort: Pulmonary effort is normal. No respiratory distress.     Breath sounds: Normal breath sounds. No wheezing or rales.  Abdominal:     General: Bowel sounds are normal. There is no distension.     Palpations: Abdomen is soft. There is no mass.     Tenderness: There is no  abdominal tenderness.     Hernia: A hernia is present. Hernia is present in the umbilical area.     Comments: The patient has bilateral inguinal small lumps, these are soft, nontender, nonmobile, there is no incarcerated hernia, there is no lymphadenopathy in the groin  Genitourinary:    Comments: Normal-appearing penis, testicles Musculoskeletal:        General: No tenderness. Normal range of motion.     Cervical back: Normal range of motion and neck supple.  Lymphadenopathy:     Cervical: No cervical adenopathy.  Skin:    General: Skin is warm and dry.     Findings: No erythema or rash.  Neurological:     Mental Status: He is alert.     Coordination: Coordination normal.  Psychiatric:        Behavior: Behavior normal.    ED Results / Procedures / Treatments   Labs (all labs ordered are listed, but only abnormal results are displayed) Labs Reviewed  GC/CHLAMYDIA PROBE AMP ([place]) NOT AT [facility]    EKG None  Radiology No results found.  Procedures Procedures   Medications Ordered in ED Medications - No data to display  ED Course  I have reviewed the triage vital signs and the nursing notes.  Pertinent labs & imaging results that were available during my care of the patient were reviewed by me and considered in my medical decision making (see chart for details).    MDM Rules/Calculators/A&P                           Patient has a chronic reducible umbilical hernia, bilateral inguinal lumps, does not appear to have any signs of lymphogranuloma, no signs of chlamydia or gonorrhea but as males are less likely to have symptoms we will do formal test, patient will have medicines called in should he have positive results.  Patient has no other acute findings at this time, does not appear to have an incarcerated inguinal hernia, likely stable for follow-up in the outpatient setting.  Final Clinical Impression(s) / ED Diagnoses Final diagnoses:  Inguinal pain of both  sides    Rx / DC Orders ED Discharge Orders     None        Eber Hong, MD 10/30/21 1657

## 2021-10-30 NOTE — Discharge Instructions (Signed)
Your testing was sent for gonorrhea and chlamydia, if these tests are positive you will need to have an antibiotic and you will be contacted to have this antibiotic called into your pharmacy.  Because you do have small swelling areas down there I would recommend that you follow-up with a general surgeon for an evaluation for possible early hernias however this did not appear emergent.  If the pain or swelling gets worse please come back to the emergency department immediately.

## 2021-10-30 NOTE — ED Triage Notes (Signed)
Pain in lower abdomen radiating into groin area for 2 weeks, denies discharge

## 2021-10-31 LAB — GC/CHLAMYDIA PROBE AMP (~~LOC~~) NOT AT ARMC
Chlamydia: NEGATIVE
Comment: NEGATIVE
Comment: NORMAL
Neisseria Gonorrhea: NEGATIVE

## 2022-04-17 ENCOUNTER — Other Ambulatory Visit: Payer: Self-pay

## 2022-04-17 ENCOUNTER — Emergency Department (HOSPITAL_COMMUNITY)
Admission: EM | Admit: 2022-04-17 | Discharge: 2022-04-17 | Disposition: A | Discharge status: Home / Self Care | Payer: Self-pay | Attending: Emergency Medicine | Admitting: Emergency Medicine

## 2022-04-17 ENCOUNTER — Encounter (HOSPITAL_COMMUNITY): Payer: Self-pay

## 2022-04-17 DIAGNOSIS — L0100 Impetigo, unspecified: Secondary | ICD-10-CM | POA: Insufficient documentation

## 2022-04-17 MED ORDER — MUPIROCIN 2 % EX OINT: 1.0000 "application " | TOPICAL_OINTMENT | Freq: Two times a day (BID) | CUTANEOUS | 0 refills | Status: AC

## 2022-04-17 MED ORDER — MUPIROCIN 2 % EX OINT: 1.0000 "application " | TOPICAL_OINTMENT | Freq: Two times a day (BID) | CUTANEOUS | 0 refills | Status: DC

## 2022-04-17 MED ORDER — SULFAMETHOXAZOLE-TRIMETHOPRIM 800-160 MG PO TABS
1.0000 | ORAL_TABLET | Freq: Once | ORAL | Status: AC
  Administered 2022-04-17: 1 via ORAL
  Filled 2022-04-17: qty 1

## 2022-04-17 MED ORDER — SULFAMETHOXAZOLE-TRIMETHOPRIM 800-160 MG PO TABS: 1.0000 | ORAL_TABLET | Freq: Two times a day (BID) | ORAL | 0 refills | Status: AC

## 2022-04-17 NOTE — ED Triage Notes (Signed)
Patient with complaints of 2 small abscesses to face that started a week prior.  ?

## 2022-04-17 NOTE — ED Provider Notes (Signed)
?[place] EMERGENCY DEPARTMENT ?Provider Note ? ? ?CSN: [id number] ?Arrival date & time: 04/17/22  0757 ? ?  ? ?History ? ?Chief Complaint  ?Patient presents with  ? Abscess  ? ? ?Trevor TOUCHET is a 51 y.o. male. ? ?Pt is a 51 yo male with no significant pmhx.  He has noticed 2 spots on his face that have been there for about a week.  He said they have been draining, but have not gone away.  Pt denies any f/c. ? ? ?  ? ?Home Medications ?Prior to Admission medications   ?Medication Sig Start Date End Date Taking? Authorizing Provider  ?sulfamethoxazole-trimethoprim (BACTRIM DS) 800-160 MG tablet Take 1 tablet by mouth 2 (two) times daily for 7 days. 04/17/22 04/24/22 Yes Jacalyn Lefevre, MD  ?acetaminophen (TYLENOL) 500 MG tablet Take 500 mg by mouth every 6 (six) hours as needed for mild pain or moderate pain.    [provider]  ?amoxicillin (AMOXIL) 500 MG capsule Take 1 capsule (500 mg total) by mouth 3 (three) times daily. 12/25/18   Gilda Crease, MD  ?HYDROcodone-acetaminophen (NORCO/VICODIN) 5-325 MG tablet Take 1-2 tablets by mouth every 4 (four) hours as needed. 12/25/18   Gilda Crease, MD  ?methocarbamol (ROBAXIN-750) 750 MG tablet Take 1 tablet (750 mg total) by mouth 4 (four) times daily. 10/05/15   Lorre Nick, MD  ?mupirocin ointment (BACTROBAN) 2 % Apply 1 application. topically 2 (two) times daily. 04/17/22   Jacalyn Lefevre, MD  ?Pseudoeph-Doxylamine-DM-APAP (NYQUIL MULTI-SYMPTOM PO) Take 2 capsules by mouth at bedtime as needed (for cold/flu symptoms).    [provider]  ?   ? ?Allergies    ?Patient has no known allergies.   ? ?Review of Systems   ?Review of Systems  ?Skin:  Positive for rash.  ?All other systems reviewed and are negative. ? ?Physical Exam ?Updated Vital Signs ?BP (!) 135/96 (BP Location: Left Arm)   Pulse (!) 59   Temp 97.8 ?F (36.6 ?C) (Oral)   Resp 14   Ht 6' (1.829 m)   Wt 81.6 kg   BMI 24.41 kg/m?  ?Physical Exam ?Vitals and nursing  note reviewed.  ?Constitutional:   ?   Appearance: Normal appearance.  ?HENT:  ?   Head: Normocephalic and atraumatic.  ?   Right Ear: External ear normal.  ?   Left Ear: External ear normal.  ?   Nose: Nose normal.  ?   Mouth/Throat:  ?   Mouth: Mucous membranes are moist.  ?   Pharynx: Oropharynx is clear.  ?Eyes:  ?   Extraocular Movements: Extraocular movements intact.  ?   Conjunctiva/sclera: Conjunctivae normal.  ?   Pupils: Pupils are equal, round, and reactive to light.  ?Cardiovascular:  ?   Rate and Rhythm: Normal rate and regular rhythm.  ?   Pulses: Normal pulses.  ?   Heart sounds: Normal heart sounds.  ?Pulmonary:  ?   Effort: Pulmonary effort is normal.  ?   Breath sounds: Normal breath sounds.  ?Abdominal:  ?   General: Abdomen is flat. Bowel sounds are normal.  ?   Palpations: Abdomen is soft.  ?Musculoskeletal:     ?   General: Normal range of motion.  ?   Cervical back: Normal range of motion and neck supple.  ?Skin: ?   Capillary Refill: Capillary refill takes less than 2 seconds.  ?   Comments: 2 small impetigo lesions (dime sized) on his face in  his beard area. No fluctuance.  ?Neurological:  ?   General: No focal deficit present.  ?   Mental Status: He is alert and oriented to person, place, and time.  ?Psychiatric:     ?   Mood and Affect: Mood normal.     ?   Behavior: Behavior normal.  ? ? ?ED Results / Procedures / Treatments   ?Labs ?(all labs ordered are listed, but only abnormal results are displayed) ?Labs Reviewed - No data to display ? ?EKG ?None ? ?Radiology ?No results found. ? ?Procedures ?Procedures  ? ? ?Medications Ordered in ED ?Medications  ?sulfamethoxazole-trimethoprim (BACTRIM DS) 800-160 MG per tablet 1 tablet (has no administration in time range)  ? ? ?ED Course/ Medical Decision Making/ A&P ?  ?                        ?Medical Decision Making ?Risk ?Prescription drug management. ? ? ?This patient presents to the ED for concern of rash, this involves an extensive number  of treatment options, and is a complaint that carries with it a high risk of complications and morbidity.  The differential diagnosis includes abscess, cellulitis, dermatitis ? ? ?Co morbidities that complicate the patient evaluation ? ?none ? ? ?Additional history obtained: ? ?Additional history obtained from epic chart review ? ? ? ?Medicines ordered and prescription drug management: ? ?I ordered medication including bactrim  for impetigo  ?Reevaluation of the patient after these medicines showed that the patient stayed the same ?I have reviewed the patients home medicines and have made adjustments as needed ? ? ? ?Problem List / ED Course: ? ?Impetigo:  pt started on bactrim and mupirocin.  Good Rx coupon given.  Pt told to throw away razor.  Return if worse. ? ?Social Determinants of Health: ? ?Lack of insurance ? ? ?Dispostion: ? ?After consideration of the diagnostic results and the patients response to treatment, I feel that the patent would benefit from discharge with outpatient f/u.   ? ? ? ? ? ? ? ?Final Clinical Impression(s) / ED Diagnoses ?Final diagnoses:  ?Impetigo  ? ? ?Rx / DC Orders ?ED Discharge Orders   ? ?      Ordered  ?  mupirocin ointment (BACTROBAN) 2 %  2 times daily,   Status:  Discontinued       ? 04/17/22 0812  ?  sulfamethoxazole-trimethoprim (BACTRIM DS) 800-160 MG tablet  2 times daily       ? 04/17/22 0812  ?  mupirocin ointment (BACTROBAN) 2 %  2 times daily       ? 04/17/22 7048  ? ?  ?  ? ?  ? ? ?  ?Jacalyn Lefevre, MD ?04/17/22 8891 ? ?

## 2022-06-19 ENCOUNTER — Emergency Department (HOSPITAL_COMMUNITY): Payer: Self-pay

## 2022-06-19 ENCOUNTER — Emergency Department (HOSPITAL_COMMUNITY)
Admission: EM | Admit: 2022-06-19 | Discharge: 2022-06-19 | Disposition: A | Discharge status: Home / Self Care | Payer: Self-pay | Attending: Emergency Medicine | Admitting: Emergency Medicine

## 2022-06-19 ENCOUNTER — Other Ambulatory Visit: Payer: Self-pay

## 2022-06-19 ENCOUNTER — Encounter (HOSPITAL_COMMUNITY): Payer: Self-pay | Admitting: Emergency Medicine

## 2022-06-19 DIAGNOSIS — M722 Plantar fascial fibromatosis: Secondary | ICD-10-CM | POA: Insufficient documentation

## 2022-06-19 MED ORDER — NAPROXEN 375 MG PO TABS: 375.0000 mg | ORAL_TABLET | Freq: Two times a day (BID) | ORAL | 0 refills | Status: AC

## 2024-12-10 DIAGNOSIS — W228XXA Striking against or struck by other objects, initial encounter: Secondary | ICD-10-CM | POA: Insufficient documentation

## 2024-12-10 DIAGNOSIS — S0502XA Injury of conjunctiva and corneal abrasion without foreign body, left eye, initial encounter: Secondary | ICD-10-CM | POA: Insufficient documentation

## 2024-12-11 ENCOUNTER — Emergency Department (HOSPITAL_COMMUNITY)
Admission: EM | Admit: 2024-12-11 | Discharge: 2024-12-11 | Disposition: A | Discharge status: Home / Self Care | Payer: Self-pay

## 2024-12-11 ENCOUNTER — Other Ambulatory Visit: Payer: Self-pay

## 2024-12-11 ENCOUNTER — Encounter (HOSPITAL_COMMUNITY): Payer: Self-pay

## 2024-12-11 DIAGNOSIS — S0502XA Injury of conjunctiva and corneal abrasion without foreign body, left eye, initial encounter: Secondary | ICD-10-CM

## 2024-12-11 MED ORDER — TETRACAINE HCL 0.5 % OP SOLN
2.0000 [drp] | Freq: Once | OPHTHALMIC | Status: AC
  Administered 2024-12-11: 01:00:00 2 [drp] via OPHTHALMIC
  Filled 2024-12-11: qty 4

## 2024-12-11 MED ORDER — FLUORESCEIN SODIUM 1 MG OP STRP
1.0000 | ORAL_STRIP | Freq: Once | OPHTHALMIC | Status: AC
  Administered 2024-12-11: 01:00:00 1 via OPHTHALMIC
  Filled 2024-12-11: qty 1

## 2024-12-11 MED FILL — Erythromycin Ophth Oint 5 MG/GM: OPHTHALMIC | Qty: 3.5 | Status: AC

## 2024-12-11 NOTE — ED Triage Notes (Signed)
 Pov from home cc of left eye pain. Says that he was cutting wood around 7-8pm and got some in it, thought he got it out but says it still hurts and can feel it.  Denies any issues with his vision 3/10

## 2024-12-11 NOTE — Discharge Instructions (Signed)
 You can use your erythromycin  ointment 4 times a day.  You can alternate Tylenol  and Motrin  as needed for pain.  You can use artificial tears or rinse your eye with a neutral saline solution for your eyes that can be found over-the-counter.  Please call ophthalmology tomorrow morning and make a follow-up appointment with them.  Tell them you are seen in the ER after you fell you got wood in your eye at work and you have a corneal abrasion.  Return to the ER for any new or worsening symptoms.

## 2024-12-11 NOTE — ED Provider Notes (Signed)
 [place] EMERGENCY DEPARTMENT AT [facility] Provider Note   CSN: [id number] Arrival date & time: 12/10/24  2345     Patient presents with: No chief complaint on file.   Trevor Hicks is a 53 y.o. male.   53 year old male presents for evaluation of foreign body in his eye.  States he was working with wood earlier and felt he got hit in the eye with a piece of wood.  States initially he felt fine but after a few hours felt like he had a piece of wood stuck in his eye.  Does state he flushed his eye at home.  He denies any other symptoms or concerns at this time.        Prior to Admission medications  Medication Sig Start Date End Date Taking? Authorizing Provider  acetaminophen  (TYLENOL ) 500 MG tablet Take 500 mg by mouth every 6 (six) hours as needed for mild pain or moderate pain.    [provider]  amoxicillin  (AMOXIL ) 500 MG capsule Take 1 capsule (500 mg total) by mouth 3 (three) times daily. 12/25/18   Haze Lonni PARAS, MD  HYDROcodone -acetaminophen  (NORCO/VICODIN) 5-325 MG tablet Take 1-2 tablets by mouth every 4 (four) hours as needed. 12/25/18   Haze Lonni PARAS, MD  methocarbamol  (ROBAXIN -750) 750 MG tablet Take 1 tablet (750 mg total) by mouth 4 (four) times daily. 10/05/15   Dasie Faden, MD  mupirocin  ointment (BACTROBAN ) 2 % Apply 1 application. topically 2 (two) times daily. 04/17/22   Dean Clarity, MD  naproxen  (NAPROSYN ) 375 MG tablet Take 1 tablet (375 mg total) by mouth 2 (two) times daily. 06/19/22   Randol Simmonds, MD  Pseudoeph-Doxylamine-DM-APAP (NYQUIL MULTI-SYMPTOM PO) Take 2 capsules by mouth at bedtime as needed (for cold/flu symptoms).    [provider]    Allergies: Patient has no known allergies.    Review of Systems  Constitutional:  Negative for chills and fever.  HENT:  Negative for ear pain and sore throat.   Eyes:  Positive for pain. Negative for visual disturbance.  Respiratory:  Negative for cough and  shortness of breath.   Cardiovascular:  Negative for chest pain and palpitations.  Gastrointestinal:  Negative for abdominal pain and vomiting.  Genitourinary:  Negative for dysuria and hematuria.  Musculoskeletal:  Negative for arthralgias and back pain.  Skin:  Negative for color change and rash.  Neurological:  Negative for seizures and syncope.  All other systems reviewed and are negative.   Updated Vital Signs BP (!) 148/88   Pulse (!) 54   Temp 97.9 F (36.6 C)   Resp 18   Ht 6' (1.829 m)   Wt 86.2 kg   SpO2 99%   BMI 25.77 kg/m   Physical Exam Vitals and nursing note reviewed.  Constitutional:      General: He is not in acute distress.    Appearance: He is well-developed.  HENT:     Head: Normocephalic and atraumatic.  Eyes:     Comments: Left conjunctiva injected and sclera slightly injected, there is no evidence of foreign body, Woods lamp exam is negative for Sidel sign but patient does have a corneal abrasion, visual acuity is intact  Cardiovascular:     Rate and Rhythm: Normal rate and regular rhythm.     Heart sounds: No murmur heard. Pulmonary:     Effort: Pulmonary effort is normal. No respiratory distress.     Breath sounds: Normal breath sounds.  Abdominal:  Palpations: Abdomen is soft.     Tenderness: There is no abdominal tenderness.  Musculoskeletal:        General: No swelling.     Cervical back: Neck supple.  Skin:    General: Skin is warm and dry.     Capillary Refill: Capillary refill takes less than 2 seconds.  Neurological:     Mental Status: He is alert.  Psychiatric:        Mood and Affect: Mood normal.     (all labs ordered are listed, but only abnormal results are displayed) Labs Reviewed - No data to display  EKG: None  Radiology: No results found.   Procedures   Medications Ordered in the ED  tetracaine  (PONTOCAINE) 0.5 % ophthalmic solution 2 drop (has no administration in time range)  fluorescein  ophthalmic strip  1 strip (has no administration in time range)  erythromycin  ophthalmic ointment (has no administration in time range)                                    Medical Decision Making Social determinants of health: Marijuana abuse  Patient here for possible foreign body in eye.  There is no signs of foreign body, and Woods lamp reveals negative Seidel sign but patient does have a corneal abrasion.  Will give him erythromycin  ointment here to use at home.  Advised artificial tears and flushing eye as needed with neutral solution.  Advise close up with ophthalmology.  Given name and phone number of ophthalmology office and advised to call in the morning to make a close follow-up appointment for later this week or early next week.  Advised return to the ER for any new or worsening symptoms.  He feels comfortable to plan be discharged home.  Problems Addressed: Abrasion of left cornea, initial encounter: acute illness or injury  Amount and/or Complexity of Data Reviewed External Data Reviewed: notes.    Details: Prior ED records reviewed and patient seen on 06/19/2024 for plantar fasciitis  Risk OTC drugs. Prescription drug management. Diagnosis or treatment significantly limited by social determinants of health.    Final diagnoses:  Abrasion of left cornea, initial encounter    ED Discharge Orders     None          Gennaro Duwaine CROME, DO 12/11/24 [phone]
